# Patient Record
Sex: Male | Born: 1935 | Race: White | Hispanic: No | Smoking: Former smoker
Health system: Southern US, Community
[De-identification: ages and names within clinical notes are randomized; demographics above are authoritative.]

## PROBLEM LIST (undated history)

## (undated) DIAGNOSIS — I10 Essential (primary) hypertension: Secondary | ICD-10-CM

## (undated) DIAGNOSIS — M1990 Unspecified osteoarthritis, unspecified site: Secondary | ICD-10-CM

## (undated) DIAGNOSIS — I639 Cerebral infarction, unspecified: Secondary | ICD-10-CM

## (undated) HISTORY — DX: Cerebral infarction, unspecified: I63.9

## (undated) HISTORY — PX: EYE SURGERY: SHX253

## (undated) HISTORY — PX: SPINE SURGERY: SHX786

## (undated) HISTORY — DX: Unspecified osteoarthritis, unspecified site: M19.90

## (undated) HISTORY — DX: Essential (primary) hypertension: I10

---

## 2015-10-08 DIAGNOSIS — N400 Enlarged prostate without lower urinary tract symptoms: Secondary | ICD-10-CM | POA: Insufficient documentation

## 2015-10-08 DIAGNOSIS — E785 Hyperlipidemia, unspecified: Secondary | ICD-10-CM | POA: Insufficient documentation

## 2015-10-08 DIAGNOSIS — I10 Essential (primary) hypertension: Secondary | ICD-10-CM | POA: Insufficient documentation

## 2015-10-08 DIAGNOSIS — J309 Allergic rhinitis, unspecified: Secondary | ICD-10-CM | POA: Insufficient documentation

## 2016-11-01 DIAGNOSIS — R0609 Other forms of dyspnea: Secondary | ICD-10-CM | POA: Insufficient documentation

## 2017-05-10 DIAGNOSIS — G8929 Other chronic pain: Secondary | ICD-10-CM | POA: Insufficient documentation

## 2018-10-29 DIAGNOSIS — H906 Mixed conductive and sensorineural hearing loss, bilateral: Secondary | ICD-10-CM | POA: Insufficient documentation

## 2019-06-12 DIAGNOSIS — R001 Bradycardia, unspecified: Secondary | ICD-10-CM | POA: Insufficient documentation

## 2020-10-21 ENCOUNTER — Emergency Department (INDEPENDENT_AMBULATORY_CARE_PROVIDER_SITE_OTHER)
Admission: RE | Admit: 2020-10-21 | Discharge: 2020-10-21 | Disposition: A | Discharge status: Home / Self Care | Payer: Medicare Other | Source: Ambulatory Visit

## 2020-10-21 ENCOUNTER — Emergency Department (INDEPENDENT_AMBULATORY_CARE_PROVIDER_SITE_OTHER): Payer: Medicare Other

## 2020-10-21 ENCOUNTER — Other Ambulatory Visit: Payer: Self-pay

## 2020-10-21 VITALS — BP 160/74 | HR 60 | Temp 98.3°F | Ht 71.0 in | Wt 190.0 lb

## 2020-10-21 DIAGNOSIS — M436 Torticollis: Secondary | ICD-10-CM

## 2020-10-21 DIAGNOSIS — M542 Cervicalgia: Secondary | ICD-10-CM

## 2020-10-21 DIAGNOSIS — M4720 Other spondylosis with radiculopathy, site unspecified: Secondary | ICD-10-CM

## 2020-10-21 MED ORDER — DICLOFENAC SODIUM 75 MG PO TBEC
75.0000 mg | DELAYED_RELEASE_TABLET | Freq: Two times a day (BID) | ORAL | 0 refills | Status: DC
Start: 2020-10-21 — End: 2022-08-02

## 2020-10-21 MED ORDER — METHOCARBAMOL 500 MG PO TABS: 500.0000 mg | ORAL_TABLET | Freq: Four times a day (QID) | ORAL | 0 refills | Status: DC

## 2020-10-21 NOTE — Discharge Instructions (Signed)
Return if any problems.

## 2020-10-21 NOTE — ED Triage Notes (Signed)
Neck pain x 3 days, denies injury Vaccinated

## 2020-10-22 ENCOUNTER — Telehealth: Payer: Self-pay

## 2020-10-22 NOTE — ED Provider Notes (Signed)
[facility]    CSN: [id number] Arrival date & time: 10/21/20  1739      History   Chief Complaint Chief Complaint  Patient presents with   Neck Pain    HPI Eric Villa is a 84 y.o. male.   The history is provided by the patient.  Neck Pain Pain location:  Occipital region Quality:  Aching Pain radiates to:  Does not radiate Pain severity:  Moderate Pain is:  Same all the time Onset quality:  Gradual Duration:  4 days Progression:  Worsening Chronicity:  New Relieved by:  Nothing Worsened by:  Nothing Ineffective treatments:  None tried Associated symptoms: no numbness   Risk factors: hx of spinal trauma   Pt noticed pain after waking up  History reviewed. No pertinent past medical history.  There are no problems to display for this patient.   History reviewed. No pertinent surgical history.     Home Medications    Prior to Admission medications   Medication Sig Start Date End Date Taking? Authorizing Provider  quinapril (ACCUPRIL) 10 MG tablet Take 10 mg by mouth daily.   Yes [provider]  simvastatin (ZOCOR) 10 MG tablet Take 10 mg by mouth daily.   Yes [provider]  diclofenac (VOLTAREN) 75 MG EC tablet Take 1 tablet (75 mg total) by mouth 2 (two) times daily. 10/21/20   Elson Areas, PA-C  methocarbamol (ROBAXIN) 500 MG tablet Take 1 tablet (500 mg total) by mouth 4 (four) times daily. 10/21/20   Elson Areas, PA-C    Family History Family History  Problem Relation Age of Onset   Tuberculosis Mother     Social History Social History   Tobacco Use   Smoking status: Former Smoker   Smokeless tobacco: Never Used  Building services engineer Use: Never used  Substance Use Topics   Alcohol use: Yes   Drug use: Not Currently     Allergies   Patient has no known allergies.   Review of Systems Review of Systems  Musculoskeletal: Positive for neck pain.  Neurological: Negative for numbness.  All  other systems reviewed and are negative.    Physical Exam Triage Vital Signs ED Triage Vitals  Enc Vitals Group     BP 10/21/20 1757 (!) 160/74     Pulse Rate 10/21/20 1757 60     Resp --      Temp 10/21/20 1757 98.3 F (36.8 C)     Temp Source 10/21/20 1757 Oral     SpO2 10/21/20 1757 97 %     Weight 10/21/20 1759 190 lb (86.2 kg)     Height 10/21/20 1759 5\' 11"  (1.803 m)     Head Circumference --      Peak Flow --      Pain Score 10/21/20 1757 8     Pain Loc --      Pain Edu? --      Excl. in GC? --    No data found.  Updated Vital Signs BP (!) 160/74 (BP Location: Right Arm)    Pulse 60    Temp 98.3 F (36.8 C) (Oral)    Ht 5\' 11"  (1.803 m)    Wt 86.2 kg    SpO2 97%    BMI 26.50 kg/m   Visual Acuity Right Eye Distance:   Left Eye Distance:   Bilateral Distance:    Right Eye Near:   Left Eye Near:    Bilateral  Near:     Physical Exam Vitals and nursing note reviewed.  Constitutional:      Appearance: He is well-developed.  HENT:     Head: Normocephalic and atraumatic.  Eyes:     Conjunctiva/sclera: Conjunctivae normal.  Cardiovascular:     Rate and Rhythm: Normal rate and regular rhythm.     Heart sounds: No murmur heard.   Pulmonary:     Effort: Pulmonary effort is normal. No respiratory distress.     Breath sounds: Normal breath sounds.  Abdominal:     Palpations: Abdomen is soft.     Tenderness: There is no abdominal tenderness.  Musculoskeletal:        General: Tenderness present. No swelling.     Cervical back: Neck supple.     Comments: Tender lower neck muscular area , pain with movement,  nv and ns intact  Skin:    General: Skin is warm and dry.  Neurological:     General: No focal deficit present.     Mental Status: He is alert.  Psychiatric:        Mood and Affect: Mood normal.      UC Treatments / Results  Labs (all labs ordered are listed, but only abnormal results are displayed) Labs Reviewed - No data to  display  EKG   Radiology DG Cervical Spine Complete  Result Date: 10/21/2020 CLINICAL DATA:  84 year old male with neck pain. EXAM: CERVICAL SPINE - COMPLETE 4+ VIEW COMPARISON:  None. FINDINGS: There is no acute fracture or subluxation of the cervical spine. There is straightening of normal cervical lordosis which may be positional or due to muscle spasm. Multilevel degenerative changes and spurring. The visualized posterior elements and odontoid appear intact. There is anatomic alignment of the lateral masses of C1 and C2. The soft tissues are unremarkable. IMPRESSION: 1. No acute/traumatic cervical spine pathology. 2. Multilevel degenerative changes. Electronically Signed   By: Elgie Collard M.D.   On: 10/21/2020 18:42    Procedures Procedures (including critical care time)  Medications Ordered in UC Medications - No data to display  Initial Impression / Assessment and Plan / UC Course  I have reviewed the triage vital signs and the nursing notes.  Pertinent labs & imaging results that were available during my care of the patient were reviewed by me and considered in my medical decision making (see chart for details).     MDM:  Xray  No fracture  Pt counseled on torticollis.  Pt advised to follow up with his MD or return for recheck Final Clinical Impressions(s) / UC Diagnoses   Final diagnoses:  Torticollis  Osteoarthritis of spine with radiculopathy, unspecified spinal region     Discharge Instructions     Return if any problems.    ED Prescriptions    Medication Sig Dispense Auth. Provider   methocarbamol (ROBAXIN) 500 MG tablet Take 1 tablet (500 mg total) by mouth 4 (four) times daily. 20 tablet Quanesha Klimaszewski K, [place]   diclofenac (VOLTAREN) 75 MG EC tablet Take 1 tablet (75 mg total) by mouth 2 (two) times daily. 10 tablet Elson Areas, [place]    An After Visit Summary was printed and given to the patient. I have reviewed the PDMP during this encounter.    Elson Areas, [place] 10/22/20 [phone]

## 2020-10-22 NOTE — Telephone Encounter (Signed)
Pt came in to office stating med was not working. Advised to continue to take both rx'd meds and supplment with aleve every 6-8 hrs if needed in between if needed. Alternate heat and ice/ massage. Pt has f/u with PCP 12/8

## 2021-11-04 IMAGING — DX DG CERVICAL SPINE COMPLETE 4+V
6 series · 6 of 6 positions shown · non-contrast
Comparison: None.

CLINICAL DATA: 84-year-old male with neck pain.

EXAM:
CERVICAL SPINE - COMPLETE 4+ VIEW

[c-spine lat]
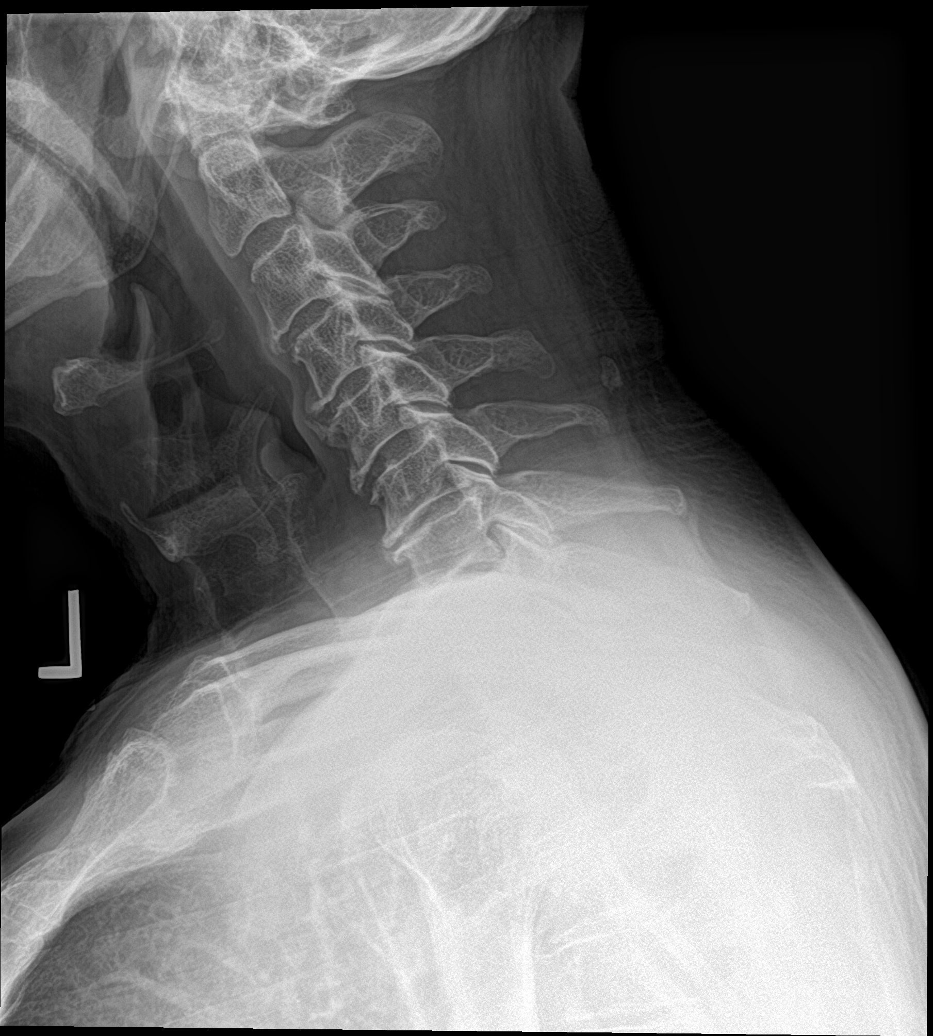

[c-spine obl (1 of 2)]
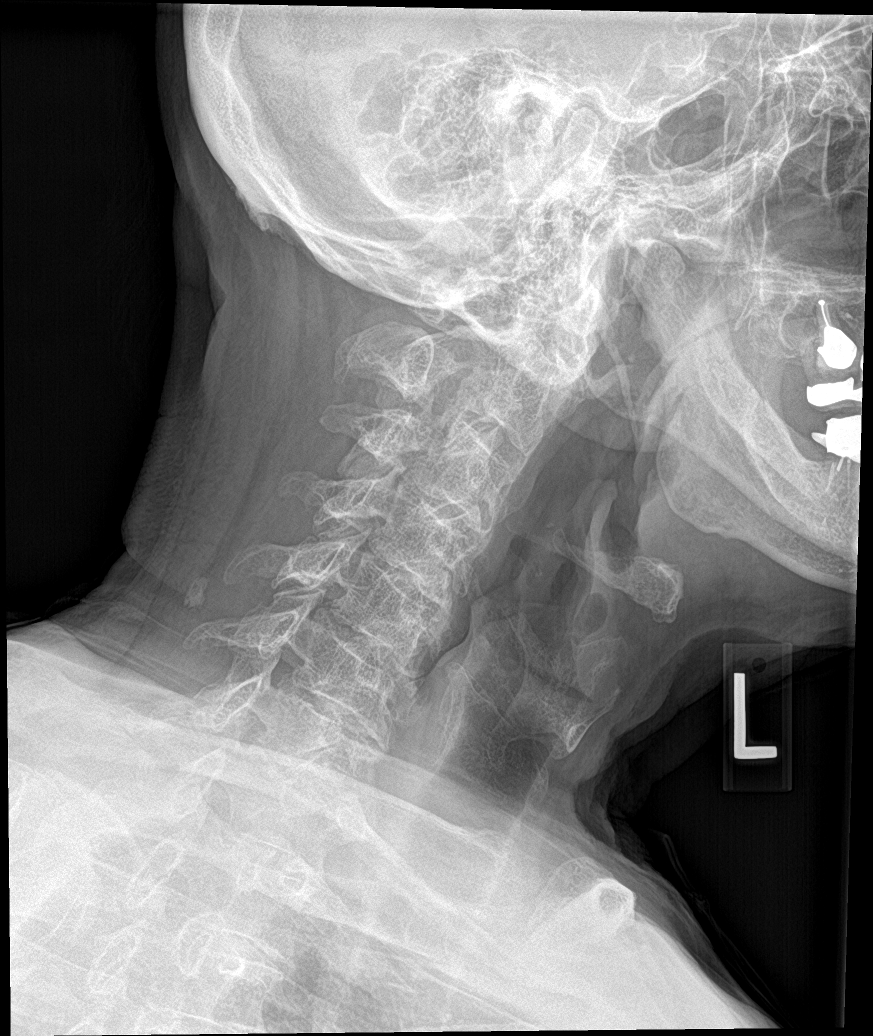

[c-spine obl (2 of 2)]
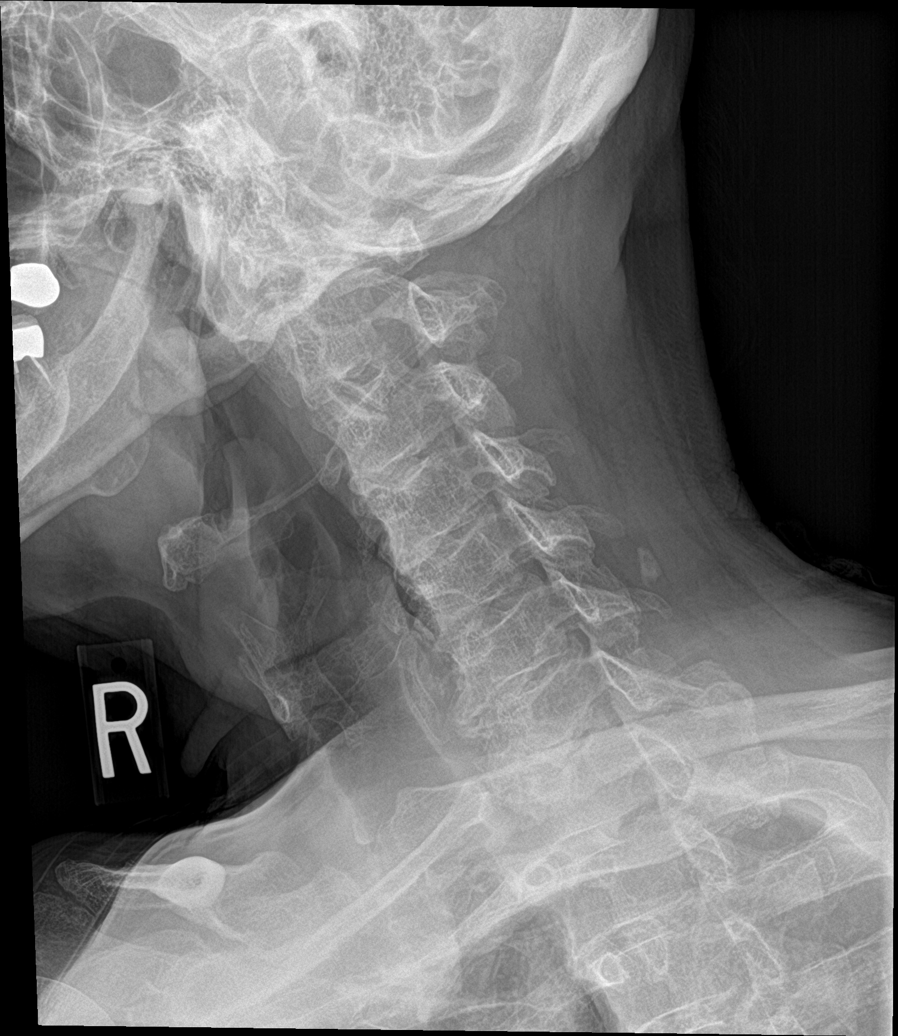

[c-spine ap]
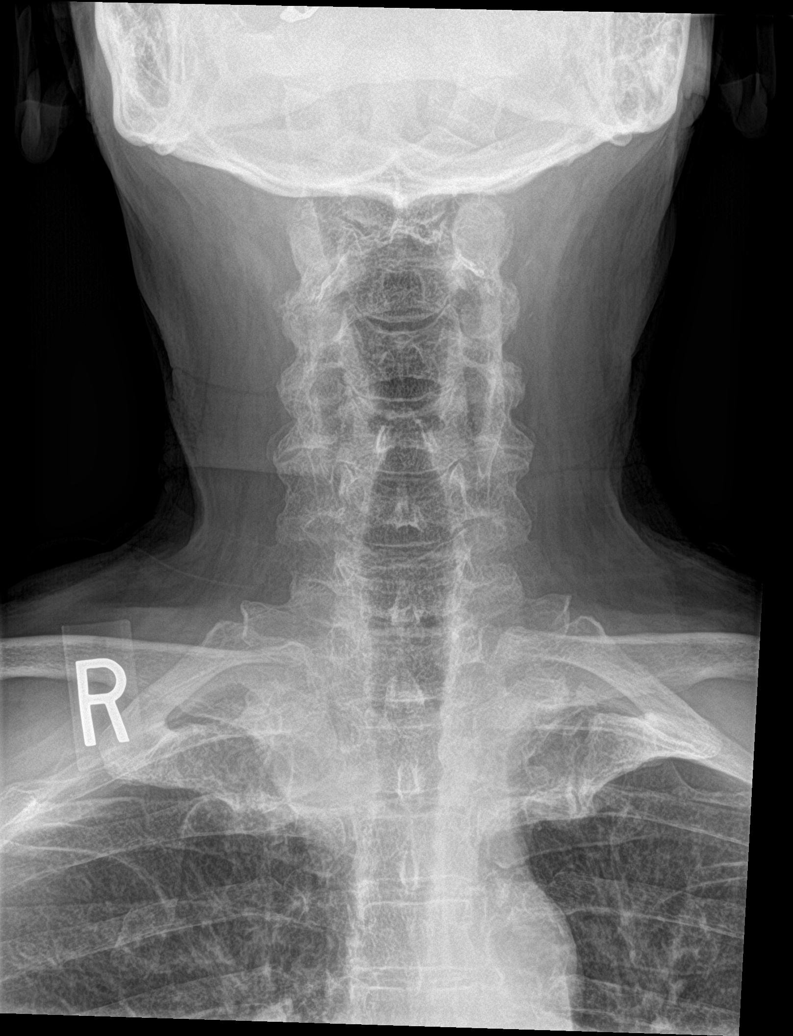

[c-spine open mouth]
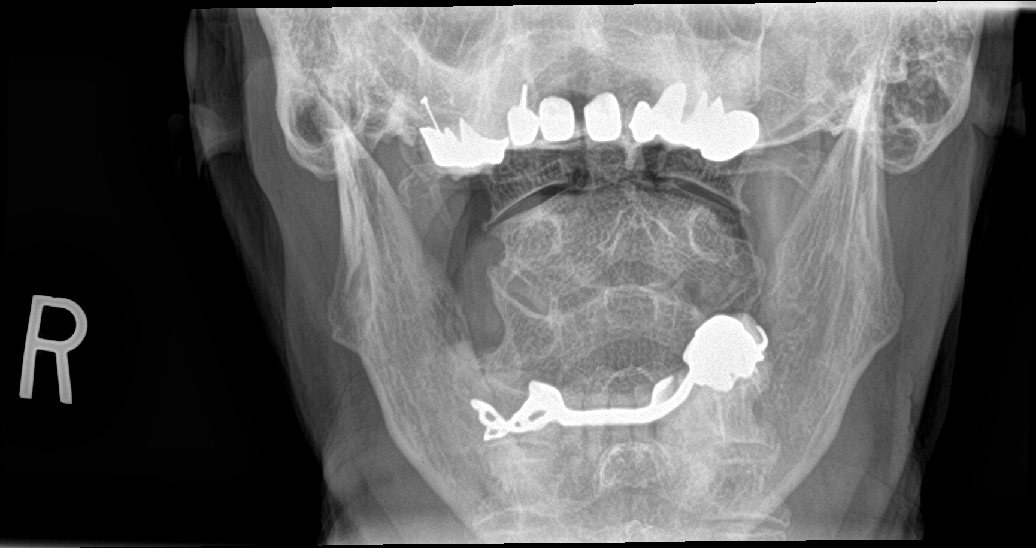

[c-spine swimmers]
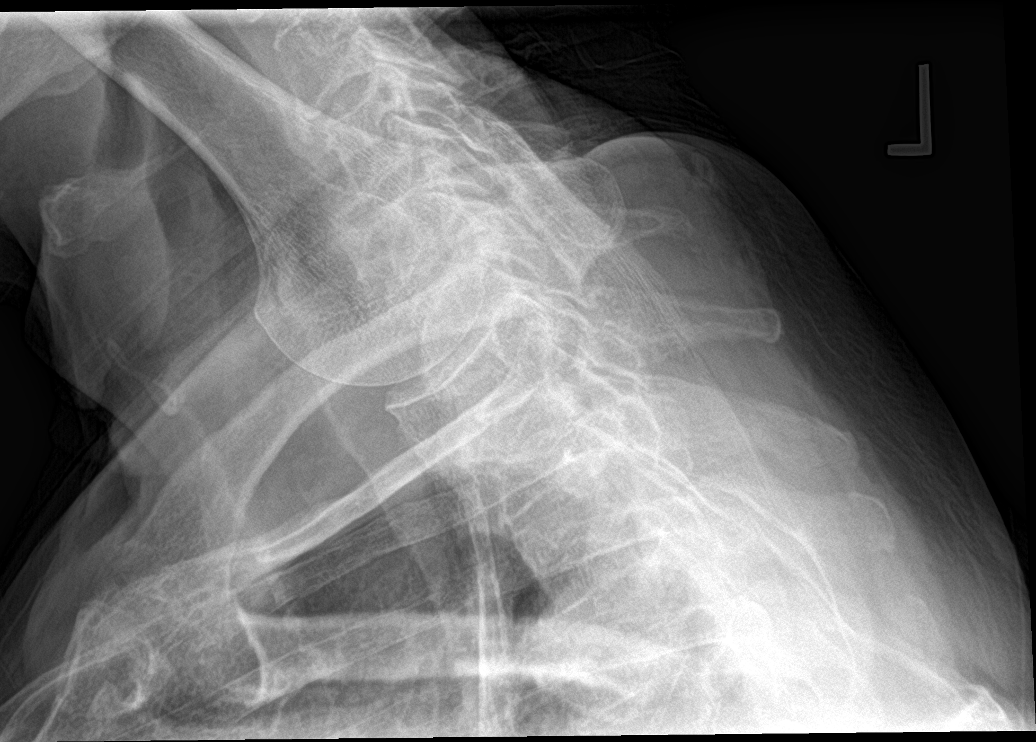

[6 of 6 positions shown; findings below may reference images not displayed]

FINDINGS: There is no acute fracture or subluxation of the cervical spine.
There is straightening of normal cervical lordosis which may be
positional or due to muscle spasm. Multilevel degenerative changes
and spurring. The visualized posterior elements and odontoid appear
intact. There is anatomic alignment of the lateral masses of C1 and
C2. The soft tissues are unremarkable.
IMPRESSION: 1. No acute/traumatic cervical spine pathology.
2. Multilevel degenerative changes.

## 2022-04-27 DIAGNOSIS — I10 Essential (primary) hypertension: Secondary | ICD-10-CM | POA: Diagnosis not present

## 2022-04-27 DIAGNOSIS — E785 Hyperlipidemia, unspecified: Secondary | ICD-10-CM | POA: Insufficient documentation

## 2022-05-06 ENCOUNTER — Telehealth: Payer: Self-pay | Admitting: Family Medicine

## 2022-05-06 NOTE — Telephone Encounter (Signed)
Question: Very nice 86 year old gentleman came in to make a new patient appt with Dr.Matthews for the end of August. He feels like his Medication is causing him to retain fluid. He states his BP is good but he is retaining fluid in his legs. He wanted me to ask if Dr. Ashley Royalty would consider "working him in sooner" so he doesn't have to wait until the end of August to address this issue? Please advise and I will be happy to call patient and let him know either way.

## 2022-05-09 NOTE — Telephone Encounter (Signed)
Sure, we can try to work him in sometime in the next 3-4 weeks.  Doesn't sound super urgent.

## 2022-05-30 DIAGNOSIS — H9313 Tinnitus, bilateral: Secondary | ICD-10-CM | POA: Diagnosis not present

## 2022-05-30 DIAGNOSIS — H938X3 Other specified disorders of ear, bilateral: Secondary | ICD-10-CM | POA: Diagnosis not present

## 2022-05-30 DIAGNOSIS — H90A32 Mixed conductive and sensorineural hearing loss, unilateral, left ear with restricted hearing on the contralateral side: Secondary | ICD-10-CM | POA: Diagnosis not present

## 2022-05-30 DIAGNOSIS — Z886 Allergy status to analgesic agent status: Secondary | ICD-10-CM | POA: Diagnosis not present

## 2022-05-30 DIAGNOSIS — H90A21 Sensorineural hearing loss, unilateral, right ear, with restricted hearing on the contralateral side: Secondary | ICD-10-CM | POA: Diagnosis not present

## 2022-06-15 ENCOUNTER — Ambulatory Visit (INDEPENDENT_AMBULATORY_CARE_PROVIDER_SITE_OTHER): Payer: Medicare Other | Admitting: Family Medicine

## 2022-06-15 ENCOUNTER — Encounter: Payer: Self-pay | Admitting: Family Medicine

## 2022-06-15 DIAGNOSIS — I10 Essential (primary) hypertension: Secondary | ICD-10-CM | POA: Diagnosis not present

## 2022-06-15 DIAGNOSIS — E785 Hyperlipidemia, unspecified: Secondary | ICD-10-CM

## 2022-06-15 MED ORDER — LISINOPRIL-HYDROCHLOROTHIAZIDE 20-25 MG PO TABS: 1.0000 | ORAL_TABLET | Freq: Every day | ORAL | 3 refills | Status: DC

## 2022-06-15 NOTE — Assessment & Plan Note (Signed)
Tolerating simvastatin well at this time.  We will plan to check his lipid panel at upcoming annual exam.

## 2022-06-15 NOTE — Patient Instructions (Signed)
Change lisinopril/HCTZ to 20/25mg 

## 2022-06-15 NOTE — Assessment & Plan Note (Signed)
Blood pressure is elevated today.  Changing blood pressure medication to lisinopril 20/25 mg.  He will continue to monitor blood pressure at home.  He will schedule coming physical with me.

## 2022-06-15 NOTE — Progress Notes (Signed)
Eric Villa - 86 y.o. male MRN [id number]  Date of birth: 1936-11-15  Subjective Chief Complaint  Patient presents with   Establish Care    HPI Eric Villa is an 86 year old male here today for initial visit to establish care.  He has history of hypertension, hyperlipidemia and BPH.  Blood pressure currently treated with lisinopril hydrochlorothiazide 20/12.5 mg he has noticed some mild edema in his legs.  Edema is not painful.  He denies chest pain, shortness of breath, palpitations.  No prior history of heart failure or kidney problems.  He is taking simvastatin for management of hyperlipidemia.  Tolerating this well.  ROS:  A comprehensive ROS was completed and negative except as noted per HPI   Allergies  Allergen Reactions   Acetaminophen Other (See Comments)    GI Upset    Past Medical History:  Diagnosis Date   Arthritis    Hypertension    Stroke [facility])     Past Surgical History:  Procedure Laterality Date   EYE SURGERY     SPINE SURGERY      Social History   Socioeconomic History   Marital status: Divorced    Spouse name: Not on file   Number of children: Not on file   Years of education: Not on file   Highest education level: Not on file  Occupational History   Not on file  Tobacco Use   Smoking status: Former    Packs/day: 1.00    Years: 25.00    Total pack years: 25.00    Types: Cigarettes    Quit date: 11/21/1982    Years since quitting: 39.5    Passive exposure: Never   Smokeless tobacco: Never  Vaping Use   Vaping Use: Never used  Substance and Sexual Activity   Alcohol use: Yes    Alcohol/week: 7.0 standard drinks of alcohol    Types: 4 Glasses of wine, 1 Cans of beer, 2 Shots of liquor per week   Drug use: Not Currently   Sexual activity: Yes    Partners: Female    Birth control/protection: None  Other Topics Concern   Not on file  Social History Narrative   Not on file   Social Determinants of Health   Financial Resource Strain: Not on  file  Food Insecurity: Not on file  Transportation Needs: Not on file  Physical Activity: Not on file  Stress: Not on file  Social Connections: Not on file    Family History  Problem Relation Age of Onset   Tuberculosis Mother    Stroke Father     Health Maintenance  Topic Date Due   COVID-19 Vaccine (6 - Pfizer series) 08/21/2022 (Originally 03/11/2022)   Zoster Vaccines- Shingrix (1 of 2) 09/15/2022 (Originally 04/17/1988)   TETANUS/TDAP  06/16/2023 (Originally 04/17/1957)   INFLUENZA VACCINE  06/21/2022   Pneumonia Vaccine 67+ Years old  Completed   HPV VACCINES  Aged Out     ----------------------------------------------------------------------------------------------------------------------------------------------------------------------------------------------------------------- Physical Exam BP (!) 178/79 (BP Location: Left Arm, Patient Position: Sitting, Cuff Size: Large)   Pulse 69   Ht 5\' 11"  (1.803 m)   Wt 205 lb (93 kg)   SpO2 96%   BMI 28.59 kg/m   Physical Exam Constitutional:      Appearance: Normal appearance.  Eyes:     General: No scleral icterus. Cardiovascular:     Rate and Rhythm: Normal rate and regular rhythm.  Pulmonary:     Effort: Pulmonary effort is normal.  Breath sounds: Normal breath sounds.  Musculoskeletal:     Cervical back: Neck supple.  Neurological:     Mental Status: He is alert.  Psychiatric:        Mood and Affect: Mood normal.        Behavior: Behavior normal.     ------------------------------------------------------------------------------------------------------------------------------------------------------------------------------------------------------------------- Assessment and Plan  Benign essential hypertension Blood pressure is elevated today.  Changing blood pressure medication to lisinopril 20/25 mg.  He will continue to monitor blood pressure at home.  He will schedule coming physical with  me.  Hyperlipidemia Tolerating simvastatin well at this time.  We will plan to check his lipid panel at upcoming annual exam.  Meds ordered this encounter  Medications   lisinopril-hydrochlorothiazide (ZESTORETIC) 20-25 MG tablet    Sig: Take 1 tablet by mouth daily.    Dispense:  90 tablet    Refill:  3    No follow-ups on file.    This visit occurred during the SARS-CoV-2 public health emergency.  Safety protocols were in place, including screening questions prior to the visit, additional usage of staff PPE, and extensive cleaning of exam room while observing appropriate contact time as indicated for disinfecting solutions.

## 2022-07-18 ENCOUNTER — Ambulatory Visit: Payer: Medicare Other | Admitting: Family Medicine

## 2022-08-02 ENCOUNTER — Encounter: Payer: Self-pay | Admitting: Family Medicine

## 2022-08-02 ENCOUNTER — Ambulatory Visit (INDEPENDENT_AMBULATORY_CARE_PROVIDER_SITE_OTHER): Payer: Medicare Other | Admitting: Family Medicine

## 2022-08-02 VITALS — BP 113/68 | HR 90 | Ht 71.0 in | Wt 200.0 lb

## 2022-08-02 DIAGNOSIS — I10 Essential (primary) hypertension: Secondary | ICD-10-CM | POA: Diagnosis not present

## 2022-08-02 DIAGNOSIS — E785 Hyperlipidemia, unspecified: Secondary | ICD-10-CM | POA: Diagnosis not present

## 2022-08-02 DIAGNOSIS — Z0000 Encounter for general adult medical examination without abnormal findings: Secondary | ICD-10-CM

## 2022-08-02 NOTE — Patient Instructions (Signed)
Preventive Care 65 Years and Older, Male Preventive care refers to lifestyle choices and visits with your health care provider that can promote health and wellness. Preventive care visits are also called wellness exams. What can I expect for my preventive care visit? Counseling During your preventive care visit, your health care provider may ask about your: Medical history, including: Past medical problems. Family medical history. History of falls. Current health, including: Emotional well-being. Home life and relationship well-being. Sexual activity. Memory and ability to understand (cognition). Lifestyle, including: Alcohol, nicotine or tobacco, and drug use. Access to firearms. Diet, exercise, and sleep habits. Work and work environment. Sunscreen use. Safety issues such as seatbelt and bike helmet use. Physical exam Your health care provider will check your: Height and weight. These may be used to calculate your BMI (body mass index). BMI is a measurement that tells if you are at a healthy weight. Waist circumference. This measures the distance around your waistline. This measurement also tells if you are at a healthy weight and may help predict your risk of certain diseases, such as type 2 diabetes and high blood pressure. Heart rate and blood pressure. Body temperature. Skin for abnormal spots. What immunizations do I need?  Vaccines are usually given at various ages, according to a schedule. Your health care provider will recommend vaccines for you based on your age, medical history, and lifestyle or other factors, such as travel or where you work. What tests do I need? Screening Your health care provider may recommend screening tests for certain conditions. This may include: Lipid and cholesterol levels. Diabetes screening. This is done by checking your blood sugar (glucose) after you have not eaten for a while (fasting). Hepatitis C test. Hepatitis B test. HIV (human  immunodeficiency virus) test. STI (sexually transmitted infection) testing, if you are at risk. Lung cancer screening. Colorectal cancer screening. Prostate cancer screening. Abdominal aortic aneurysm (AAA) screening. You may need this if you are a current or former smoker. Talk with your health care provider about your test results, treatment options, and if necessary, the need for more tests. Follow these instructions at home: Eating and drinking  Eat a diet that includes fresh fruits and vegetables, whole grains, lean protein, and low-fat dairy products. Limit your intake of foods with high amounts of sugar, saturated fats, and salt. Take vitamin and mineral supplements as recommended by your health care provider. Do not drink alcohol if your health care provider tells you not to drink. If you drink alcohol: Limit how much you have to 0-2 drinks a day. Know how much alcohol is in your drink. In the [place] one drink equals one 12 oz bottle of beer (355 mL), one 5 oz glass of wine (148 mL), or one 1 oz glass of hard liquor (44 mL). Lifestyle Brush your teeth every morning and night with fluoride toothpaste. Floss one time each day. Exercise for at least 30 minutes 5 or more days each week. Do not use any products that contain nicotine or tobacco. These products include cigarettes, chewing tobacco, and vaping devices, such as e-cigarettes. If you need help quitting, ask your health care provider. Do not use drugs. If you are sexually active, practice safe sex. Use a condom or other form of protection to prevent STIs. Take aspirin only as told by your health care provider. Make sure that you understand how much to take and what form to take. Work with your health care provider to find out whether it is safe   and beneficial for you to take aspirin daily. Ask your health care provider if you need to take a cholesterol-lowering medicine (statin). Find healthy ways to manage stress, such  as: Meditation, yoga, or listening to music. Journaling. Talking to a trusted person. Spending time with friends and family. Safety Always wear your seat belt while driving or riding in a vehicle. Do not drive: If you have been drinking alcohol. Do not ride with someone who has been drinking. When you are tired or distracted. While texting. If you have been using any mind-altering substances or drugs. Wear a helmet and other protective equipment during sports activities. If you have firearms in your house, make sure you follow all gun safety procedures. Minimize exposure to UV radiation to reduce your risk of skin cancer. What's next? Visit your health care provider once a year for an annual wellness visit. Ask your health care provider how often you should have your eyes and teeth checked. Stay up to date on all vaccines. This information is not intended to replace advice given to you by your health care provider. Make sure you discuss any questions you have with your health care provider. Document Revised: 05/05/2021 Document Reviewed: 05/05/2021 Elsevier Patient Education  2023 Elsevier Inc.  

## 2022-08-02 NOTE — Progress Notes (Signed)
Eric Villa - 86 y.o. male MRN [id number]  Date of birth: Nov 14, 1936  Subjective Chief Complaint  Patient presents with   Annual Exam    HPI Eric Villa is a 86 y.o. male here today for annual exam.   Reports that he is doing quite well.  Doing well with chronic medications.  BP remains well controlled.  He has started using Hemp oil/CBD for joint pain.  No longer using voltaren gel.  He is very active.  Feels like his diet is is healthy as well.   He is a non-smoker.  He consume EtOH occasionally.   He is due for flu, Tdap and shingrix vaccines.   Review of Systems  Constitutional:  Negative for chills, fever, malaise/fatigue and weight loss.  HENT:  Negative for congestion, ear pain and sore throat.   Eyes:  Negative for blurred vision, double vision and pain.  Respiratory:  Negative for cough and shortness of breath.   Cardiovascular:  Negative for chest pain and palpitations.  Gastrointestinal:  Negative for abdominal pain, blood in stool, constipation, heartburn and nausea.  Genitourinary:  Negative for dysuria and urgency.  Musculoskeletal:  Negative for joint pain and myalgias.  Neurological:  Negative for dizziness and headaches.  Endo/Heme/Allergies:  Does not bruise/bleed easily.  Psychiatric/Behavioral:  Negative for depression. The patient is not nervous/anxious and does not have insomnia.     Allergies  Allergen Reactions   Acetaminophen Other (See Comments)    GI Upset    Past Medical History:  Diagnosis Date   Arthritis    Hypertension    Stroke [facility])     Past Surgical History:  Procedure Laterality Date   EYE SURGERY     SPINE SURGERY      Social History   Socioeconomic History   Marital status: Divorced    Spouse name: Not on file   Number of children: Not on file   Years of education: Not on file   Highest education level: Not on file  Occupational History   Not on file  Tobacco Use   Smoking status: Former    Packs/day: 1.00     Years: 25.00    Total pack years: 25.00    Types: Cigarettes    Quit date: 11/21/1982    Years since quitting: 39.7    Passive exposure: Never   Smokeless tobacco: Never  Vaping Use   Vaping Use: Never used  Substance and Sexual Activity   Alcohol use: Yes    Alcohol/week: 7.0 standard drinks of alcohol    Types: 4 Glasses of wine, 1 Cans of beer, 2 Shots of liquor per week   Drug use: Not Currently   Sexual activity: Yes    Partners: Female    Birth control/protection: None  Other Topics Concern   Not on file  Social History Narrative   Not on file   Social Determinants of Health   Financial Resource Strain: Not on file  Food Insecurity: Not on file  Transportation Needs: Not on file  Physical Activity: Not on file  Stress: Not on file  Social Connections: Not on file    Family History  Problem Relation Age of Onset   Tuberculosis Mother    Stroke Father     Health Maintenance  Topic Date Due   COVID-19 Vaccine (6 - Pfizer series) 08/21/2022 (Originally 03/11/2022)   Zoster Vaccines- Shingrix (1 of 2) 09/15/2022 (Originally 04/17/1988)   INFLUENZA VACCINE  02/19/2023 (Originally 06/21/2022)   Eric Villa  06/16/2023 (Originally 04/17/1957)   Pneumonia Vaccine 76+ Years old  Completed   HPV VACCINES  Aged Out     ----------------------------------------------------------------------------------------------------------------------------------------------------------------------------------------------------------------- Physical Exam BP 113/68 (BP Location: Left Arm, Patient Position: Sitting, Cuff Size: Normal)   Pulse 90   Ht 5\' 11"  (1.803 m)   Wt 200 lb (90.7 kg)   SpO2 95%   BMI 27.89 kg/m   Physical Exam Constitutional:      General: He is not in acute distress. HENT:     Head: Normocephalic and atraumatic.     Right Ear: Tympanic membrane and external ear normal.     Left Ear: Tympanic membrane and external ear normal.  Eyes:     General: No scleral  icterus. Neck:     Thyroid: No thyromegaly.  Cardiovascular:     Rate and Rhythm: Normal rate and regular rhythm.     Heart sounds: Normal heart sounds.  Pulmonary:     Effort: Pulmonary effort is normal.     Breath sounds: Normal breath sounds.  Abdominal:     General: Bowel sounds are normal. There is no distension.     Palpations: Abdomen is soft.     Tenderness: There is no abdominal tenderness. There is no guarding.  Musculoskeletal:     Cervical back: Normal range of motion.  Lymphadenopathy:     Cervical: No cervical adenopathy.  Skin:    General: Skin is warm and dry.     Findings: No rash.  Neurological:     Mental Status: He is alert and oriented to person, place, and time.     Cranial Nerves: No cranial nerve deficit.     Motor: No abnormal muscle tone.  Psychiatric:        Mood and Affect: Mood normal.        Behavior: Behavior normal.     ------------------------------------------------------------------------------------------------------------------------------------------------------------------------------------------------------------------- Assessment and Plan  Well adult exam Well adult Orders Placed This Encounter  Procedures   COMPLETE METABOLIC PANEL WITH GFR   CBC with Differential   Lipid Panel w/reflex Direct LDL  Screenings: per lab orders Immunizations:  He will get Shingrix at his pharmacy.  Declines flu vaccine. He believes that Tdap is up to date Anticipatory guidance/Risk factor reduction:  Recommendations per AVS.     No orders of the defined types were placed in this encounter.   Return in about 6 months (around 01/31/2023) for HTN.    This visit occurred during the SARS-CoV-2 public health emergency.  Safety protocols were in place, including screening questions prior to the visit, additional usage of staff PPE, and extensive cleaning of exam room while observing appropriate contact time as indicated for disinfecting solutions.

## 2022-08-02 NOTE — Assessment & Plan Note (Signed)
Well adult Orders Placed This Encounter  Procedures  . COMPLETE METABOLIC PANEL WITH GFR  . CBC with Differential  . Lipid Panel w/reflex Direct LDL  Screenings: per lab orders Immunizations:  He will get Shingrix at his pharmacy.  Declines flu vaccine. He believes that Tdap is up to date Anticipatory guidance/Risk factor reduction:  Recommendations per AVS.

## 2022-08-04 DIAGNOSIS — E785 Hyperlipidemia, unspecified: Secondary | ICD-10-CM | POA: Diagnosis not present

## 2022-08-04 DIAGNOSIS — Z0000 Encounter for general adult medical examination without abnormal findings: Secondary | ICD-10-CM | POA: Diagnosis not present

## 2022-08-04 DIAGNOSIS — I10 Essential (primary) hypertension: Secondary | ICD-10-CM | POA: Diagnosis not present

## 2022-08-05 LAB — COMPLETE METABOLIC PANEL WITH GFR
AG Ratio: 2 (calc) (ref 1.0–2.5)
ALT: 18 U/L (ref 9–46)
AST: 16 U/L (ref 10–35)
Albumin: 4.1 g/dL (ref 3.6–5.1)
Alkaline phosphatase (APISO): 54 U/L (ref 35–144)
BUN: 19 mg/dL (ref 7–25)
CO2: 29 mmol/L (ref 20–32)
Calcium: 10.3 mg/dL (ref 8.6–10.3)
Chloride: 102 mmol/L (ref 98–110)
Creat: 1.04 mg/dL (ref 0.70–1.22)
Globulin: 2.1 g/dL (calc) (ref 1.9–3.7)
Glucose, Bld: 103 mg/dL — ABNORMAL HIGH (ref 65–99)
Potassium: 4.3 mmol/L (ref 3.5–5.3)
Sodium: 138 mmol/L (ref 135–146)
Total Bilirubin: 1 mg/dL (ref 0.2–1.2)
Total Protein: 6.2 g/dL (ref 6.1–8.1)
eGFR: 71 mL/min/{1.73_m2} (ref >=60)

## 2022-08-05 LAB — CBC WITH DIFFERENTIAL/PLATELET
Absolute Monocytes: 495 cells/uL (ref 200–950)
Basophils Absolute: 18 cells/uL (ref 0–200)
Basophils Relative: 0.4 %
Eosinophils Absolute: 90 cells/uL (ref 15–500)
Eosinophils Relative: 2 %
HCT: 48.1 % (ref 38.5–50.0)
Hemoglobin: 16.1 g/dL (ref 13.2–17.1)
Lymphs Abs: 963 cells/uL (ref 850–3900)
MCH: 31 pg (ref 27.0–33.0)
MCHC: 33.5 g/dL (ref 32.0–36.0)
MCV: 92.5 fL (ref 80.0–100.0)
MPV: 10.4 fL (ref 7.5–12.5)
Monocytes Relative: 11 %
Neutro Abs: 2934 cells/uL (ref 1500–7800)
Neutrophils Relative %: 65.2 %
Platelets: 198 10*3/uL (ref 140–400)
RBC: 5.2 10*6/uL (ref 4.20–5.80)
RDW: 13.3 % (ref 11.0–15.0)
Total Lymphocyte: 21.4 %
WBC: 4.5 10*3/uL (ref 3.8–10.8)

## 2022-08-05 LAB — LIPID PANEL W/REFLEX DIRECT LDL
Cholesterol: 118 mg/dL (ref <200)
HDL: 43 mg/dL (ref >=40)
LDL Cholesterol (Calc): 56 mg/dL (calc)
Non-HDL Cholesterol (Calc): 75 mg/dL (calc) (ref <130)
Total CHOL/HDL Ratio: 2.7 (calc) (ref <5.0)
Triglycerides: 108 mg/dL (ref <150)

## 2022-08-15 ENCOUNTER — Ambulatory Visit (INDEPENDENT_AMBULATORY_CARE_PROVIDER_SITE_OTHER): Payer: Medicare Other | Admitting: Family Medicine

## 2022-08-15 ENCOUNTER — Ambulatory Visit (INDEPENDENT_AMBULATORY_CARE_PROVIDER_SITE_OTHER): Payer: Medicare Other

## 2022-08-15 ENCOUNTER — Telehealth: Payer: Self-pay

## 2022-08-15 ENCOUNTER — Encounter: Payer: Self-pay | Admitting: Family Medicine

## 2022-08-15 VITALS — BP 115/75 | HR 63 | Ht 71.0 in | Wt 197.0 lb

## 2022-08-15 DIAGNOSIS — J019 Acute sinusitis, unspecified: Secondary | ICD-10-CM

## 2022-08-15 DIAGNOSIS — B9689 Other specified bacterial agents as the cause of diseases classified elsewhere: Secondary | ICD-10-CM | POA: Diagnosis not present

## 2022-08-15 DIAGNOSIS — R0981 Nasal congestion: Secondary | ICD-10-CM | POA: Diagnosis not present

## 2022-08-15 DIAGNOSIS — R051 Acute cough: Secondary | ICD-10-CM

## 2022-08-15 DIAGNOSIS — R059 Cough, unspecified: Secondary | ICD-10-CM | POA: Diagnosis not present

## 2022-08-15 MED ORDER — AMOXICILLIN-POT CLAVULANATE 875-125 MG PO TABS: 1.0000 | ORAL_TABLET | Freq: Two times a day (BID) | ORAL | 0 refills | Status: DC

## 2022-08-15 NOTE — Telephone Encounter (Signed)
Called pt and left voicemail to schedule.

## 2022-08-15 NOTE — Addendum Note (Signed)
Addended by: Owens Loffler on: 08/15/2022 02:31 PM   Modules accepted: Orders

## 2022-08-15 NOTE — Assessment & Plan Note (Signed)
-   tight cough on exam. Will order cxr to rule out pna

## 2022-08-15 NOTE — Telephone Encounter (Signed)
Pt lvm stating he had a very bad cold. Please contact the patient to schedule an appt. Can be virtual.   Thanks

## 2022-08-15 NOTE — Progress Notes (Signed)
Acute Office Visit  Subjective:     Patient ID: Eric Villa, male    DOB: 10-04-1936, 86 y.o.   MRN: [id number]  Chief Complaint  Patient presents with   Cough   Nasal Congestion    Cough Associated symptoms include headaches. Pertinent negatives include no chest pain, chills, fever or shortness of breath.   Patient is in today for congestion for about two weeks. He also notes sinus pressure, headaches, and mucus production. Denies wheezing. Does say he had chills.   Review of Systems  Constitutional:  Negative for chills and fever.  HENT:  Positive for sinus pain.   Respiratory:  Positive for cough. Negative for shortness of breath.   Cardiovascular:  Negative for chest pain.  Neurological:  Positive for headaches.        Objective:    BP 115/75   Pulse 63   Ht 5\' 11"  (1.803 m)   Wt 197 lb (89.4 kg)   SpO2 96%   BMI 27.48 kg/m    Physical Exam Vitals and nursing note reviewed.  Constitutional:      General: He is not in acute distress.    Appearance: Normal appearance.  HENT:     Head: Normocephalic and atraumatic.     Comments: Sinus tenderness to palpation of frontal sinuses    Right Ear: External ear normal.     Left Ear: External ear normal.     Nose: Nose normal.     Mouth/Throat:     Pharynx: Posterior oropharyngeal erythema present. No oropharyngeal exudate.  Eyes:     Conjunctiva/sclera: Conjunctivae normal.  Cardiovascular:     Rate and Rhythm: Normal rate and regular rhythm.  Pulmonary:     Effort: Pulmonary effort is normal.     Breath sounds: Normal breath sounds. No wheezing.  Musculoskeletal:     Cervical back: No tenderness.  Neurological:     General: No focal deficit present.     Mental Status: He is alert and oriented to person, place, and time.  Psychiatric:        Mood and Affect: Mood normal.        Behavior: Behavior normal.        Thought Content: Thought content normal.        Judgment: Judgment normal.     No  results found for any visits on 08/15/22.      Assessment & Plan:   Problem List Items Addressed This Visit       Respiratory   Acute bacterial rhinosinusitis - Primary    - due to symptom onset of two weeks with sinus pressure and congestion believe this is a bacterial sinus infection - will treat with augmentin       Relevant Medications   amoxicillin-clavulanate (AUGMENTIN) 875-125 MG tablet   Congestion of nasal sinus    - flu test ordered and interpreted by myself as negative.       Relevant Orders   POCT Influenza A/B     Other   Acute cough    - tight cough on exam. Will order cxr to rule out pna      Relevant Orders   DG Chest 2 View    Meds ordered this encounter  Medications   amoxicillin-clavulanate (AUGMENTIN) 875-125 MG tablet    Sig: Take 1 tablet by mouth 2 (two) times daily for 10 days.    Dispense:  20 tablet    Refill:  0    Return if  symptoms worsen or fail to improve.  Owens Loffler, DO

## 2022-08-15 NOTE — Assessment & Plan Note (Addendum)
-   flu test ordered and interpreted by myself as negative.

## 2022-08-15 NOTE — Assessment & Plan Note (Addendum)
-   due to symptom onset of two weeks with sinus pressure and congestion believe this is a bacterial sinus infection - will treat with augmentin

## 2022-11-02 DIAGNOSIS — Z20822 Contact with and (suspected) exposure to covid-19: Secondary | ICD-10-CM | POA: Diagnosis not present

## 2022-11-02 DIAGNOSIS — R051 Acute cough: Secondary | ICD-10-CM | POA: Diagnosis not present

## 2023-02-13 DIAGNOSIS — M79671 Pain in right foot: Secondary | ICD-10-CM | POA: Diagnosis not present

## 2023-02-27 ENCOUNTER — Ambulatory Visit (INDEPENDENT_AMBULATORY_CARE_PROVIDER_SITE_OTHER): Payer: Medicare Other | Admitting: Family Medicine

## 2023-02-27 ENCOUNTER — Encounter: Payer: Self-pay | Admitting: Family Medicine

## 2023-02-27 VITALS — BP 136/84 | HR 75 | Ht 71.0 in | Wt 197.0 lb

## 2023-02-27 DIAGNOSIS — E785 Hyperlipidemia, unspecified: Secondary | ICD-10-CM | POA: Diagnosis not present

## 2023-02-27 DIAGNOSIS — I10 Essential (primary) hypertension: Secondary | ICD-10-CM | POA: Diagnosis not present

## 2023-02-27 DIAGNOSIS — H906 Mixed conductive and sensorineural hearing loss, bilateral: Secondary | ICD-10-CM | POA: Diagnosis not present

## 2023-02-27 NOTE — Patient Instructions (Addendum)
Try astepro nasal spray

## 2023-02-27 NOTE — Assessment & Plan Note (Signed)
BP remains well controlled. Continue lisinopril/hctz at current strength.   ? ?

## 2023-02-27 NOTE — Progress Notes (Signed)
Eric Villa - 87 y.o. male MRN [id number]  Date of birth: 22-Aug-1936  Subjective Chief Complaint  Patient presents with   Follow-up   Hearing Problem    HPI Eric Villa is a 87 y.o. male here today for follow up.  He reports that he is doing well.   Continues on lisinopril/hctz for management of HTN.  BP is well controlled with this.  No side effects at thist time.  He has not had new chest pain, shortness of breath, palpitations, headache or vision changes.  He does have history of CVA and remains on simvastatin.  Last LDL was 56.  He is tolerating this well at current strength.    He does have some hearing loss.  Had hearing test at [facility] for hearing aids.  Has had eustachian tube dysfunction that is chronic.  He has seen ENT previously without much help.  He reports that flonase made him feel sick.     ROS:  A comprehensive ROS was completed and negative except as noted per HPI  No Known Allergies  Past Medical History:  Diagnosis Date   Arthritis    Hypertension    Stroke     Past Surgical History:  Procedure Laterality Date   EYE SURGERY     SPINE SURGERY      Social History   Socioeconomic History   Marital status: Divorced    Spouse name: Not on file   Number of children: Not on file   Years of education: Not on file   Highest education level: Associate degree: occupational, Scientist, product/process development, or vocational program  Occupational History   Not on file  Tobacco Use   Smoking status: Former    Packs/day: 1.00    Years: 25.00    Additional pack years: 0.00    Total pack years: 25.00    Types: Cigarettes    Quit date: 11/21/1982    Years since quitting: 40.2    Passive exposure: Never   Smokeless tobacco: Never  Vaping Use   Vaping Use: Never used  Substance and Sexual Activity   Alcohol use: Yes    Alcohol/week: 7.0 standard drinks of alcohol    Types: 4 Glasses of wine, 1 Cans of beer, 2 Shots of liquor per week   Drug use: Not Currently   Sexual activity:  Yes    Partners: Female    Birth control/protection: None  Other Topics Concern   Not on file  Social History Narrative   Not on file   Social Determinants of Health   Financial Resource Strain: Not on file  Food Insecurity: No Food Insecurity (02/25/2023)   Hunger Vital Sign    Worried About Running Out of Food in the Last Year: Never true    Ran Out of Food in the Last Year: Never true  Transportation Needs: No Transportation Needs (02/25/2023)   PRAPARE - Administrator, Civil Service (Medical): No    Lack of Transportation (Non-Medical): No  Physical Activity: Insufficiently Active (02/25/2023)   Exercise Vital Sign    Days of Exercise per Week: 2 days    Minutes of Exercise per Session: 30 min  Stress: No Stress Concern Present (02/25/2023)   [employer] of Occupational Health - Occupational Stress Questionnaire    Feeling of Stress : Not at all  Social Connections: Unknown (02/25/2023)   Social Connection and Isolation Panel [NHANES]    Frequency of Communication with Friends and Family: Twice a week  Frequency of Social Gatherings with Friends and Family: Twice a week    Attends Religious Services: Never    Diplomatic Services operational officer: Not on file    Attends Banker Meetings: Not on file    Marital Status: Divorced    Family History  Problem Relation Age of Onset   Tuberculosis Mother    Stroke Father     Health Maintenance  Topic Date Due   DTaP/Tdap/Td (1 - Tdap) Never done   [employer] Annual Wellness (AWV)  08/29/2023 (Originally 11/10/2022)   COVID-19 Vaccine (7 - 2023-24 season) 09/14/2023 (Originally 12/08/2022)   INFLUENZA VACCINE  06/22/2023   Pneumonia Vaccine 51+ Years old  Completed   Zoster Vaccines- Shingrix  Completed   HPV VACCINES  Aged Out      ----------------------------------------------------------------------------------------------------------------------------------------------------------------------------------------------------------------- Physical Exam BP 136/84 (BP Location: Left Arm, Patient Position: Sitting, Cuff Size: Normal)   Pulse 75   Ht 5\' 11"  (1.803 m)   Wt 197 lb (89.4 kg)   SpO2 98%   BMI 27.48 kg/m   Physical Exam Constitutional:      Appearance: Normal appearance.  HENT:     Head: Normocephalic and atraumatic.  Eyes:     General: No scleral icterus. Cardiovascular:     Rate and Rhythm: Normal rate and regular rhythm.  Pulmonary:     Effort: Pulmonary effort is normal.     Breath sounds: Normal breath sounds.  Neurological:     Mental Status: He is alert.  Psychiatric:        Mood and Affect: Mood normal.        Behavior: Behavior normal.     ------------------------------------------------------------------------------------------------------------------------------------------------------------------------------------------------------------------- Assessment and Plan  Benign essential hypertension BP remains well controlled.  Continue lisinopril/hctz at current strength.    Hyperlipidemia Continue simvastatin at current strength.   Mixed conductive and sensorineural hearing loss of both ears He has chronic eustachian tube dysfunction.  Declines ENT referral.  Did not tolerate flonase well.  Adding astepro, he will get this OTC.     No orders of the defined types were placed in this encounter.   Return in about 6 months (around 08/29/2023) for annual exam/fasting labs.    This visit occurred during the SARS-CoV-2 public health emergency.  Safety protocols were in place, including screening questions prior to the visit, additional usage of staff PPE, and extensive cleaning of exam room while observing appropriate contact time as indicated for disinfecting solutions.

## 2023-02-27 NOTE — Assessment & Plan Note (Signed)
He has chronic eustachian tube dysfunction.  Declines ENT referral.  Did not tolerate flonase well.  Adding astepro, he will get this OTC.

## 2023-02-27 NOTE — Assessment & Plan Note (Signed)
Continue simvastatin at current strength.   

## 2023-02-28 LAB — CBC WITH DIFFERENTIAL/PLATELET
Absolute Monocytes: 524 cells/uL (ref 200–950)
Basophils Absolute: 17 cells/uL (ref 0–200)
Basophils Relative: 0.3 %
Eosinophils Absolute: 97 cells/uL (ref 15–500)
Eosinophils Relative: 1.7 %
HCT: 48.1 % (ref 38.5–50.0)
Hemoglobin: 16.2 g/dL (ref 13.2–17.1)
Lymphs Abs: 1055 cells/uL (ref 850–3900)
MCH: 30 pg (ref 27.0–33.0)
MCHC: 33.7 g/dL (ref 32.0–36.0)
MCV: 89.1 fL (ref 80.0–100.0)
MPV: 10.1 fL (ref 7.5–12.5)
Monocytes Relative: 9.2 %
Neutro Abs: 4007 cells/uL (ref 1500–7800)
Neutrophils Relative %: 70.3 %
Platelets: 227 10*3/uL (ref 140–400)
RBC: 5.4 10*6/uL (ref 4.20–5.80)
RDW: 13.3 % (ref 11.0–15.0)
Total Lymphocyte: 18.5 %
WBC: 5.7 10*3/uL (ref 3.8–10.8)

## 2023-02-28 LAB — BASIC METABOLIC PANEL
BUN: 17 mg/dL (ref 7–25)
CO2: 29 mmol/L (ref 20–32)
Calcium: 10.5 mg/dL — ABNORMAL HIGH (ref 8.6–10.3)
Chloride: 104 mmol/L (ref 98–110)
Creat: 0.94 mg/dL (ref 0.70–1.22)
Glucose, Bld: 101 mg/dL — ABNORMAL HIGH (ref 65–99)
Potassium: 4.6 mmol/L (ref 3.5–5.3)
Sodium: 142 mmol/L (ref 135–146)

## 2023-03-02 ENCOUNTER — Telehealth: Payer: Self-pay | Admitting: Family Medicine

## 2023-03-02 NOTE — Telephone Encounter (Signed)
I called patient and stated medicare doesn't cover these test to go to his local pharmacy

## 2023-03-02 NOTE — Telephone Encounter (Signed)
Medicare does not cover this in the clinic.  I would recommend that he have this completed at his local pharmacy.    Thanks!  CM

## 2023-03-02 NOTE — Telephone Encounter (Signed)
Patient is requesting Dtap/Tdap/Td vaccines on mychart please advise.

## 2023-03-21 ENCOUNTER — Telehealth: Payer: Self-pay | Admitting: Family Medicine

## 2023-03-21 NOTE — Telephone Encounter (Signed)
Called patient to schedule Medicare Annual Wellness Visit (AWV). Left message for patient to call back and schedule Medicare Annual Wellness Visit (AWV).  Last date of AWV: Never  Please schedule an appointment at any time with [facility].  If any questions, please contact me at [phone].  Thank you ,  Morgan Jessup Patient Access Advocate II Direct Dial: [phone]  

## 2023-03-21 NOTE — Telephone Encounter (Signed)
Patient called back he refused to have a Medicare Wellness Visit he stated he was fine didn't need it .

## 2023-03-26 ENCOUNTER — Other Ambulatory Visit: Payer: Self-pay | Admitting: Family Medicine

## 2023-03-27 ENCOUNTER — Other Ambulatory Visit: Payer: Self-pay

## 2023-03-27 MED ORDER — SIMVASTATIN 40 MG PO TABS
40.0000 mg | ORAL_TABLET | Freq: Every day | ORAL | 2 refills | Status: DC
  Filled 2023-03-27: qty 90, 90d supply, fill #0

## 2023-03-27 NOTE — Telephone Encounter (Signed)
Patient called requesting a refill of ;   Pharmacy ;

## 2023-03-28 ENCOUNTER — Telehealth: Payer: Self-pay | Admitting: Family Medicine

## 2023-03-28 NOTE — Telephone Encounter (Signed)
Contacted [facility] to schedule their annual wellness visit. Patient declined to schedule AWV at this time.  Cira Servant Patient Engineer, production II Direct Dial: [phone]

## 2023-04-10 DIAGNOSIS — Z85828 Personal history of other malignant neoplasm of skin: Secondary | ICD-10-CM | POA: Diagnosis not present

## 2023-04-10 DIAGNOSIS — R9431 Abnormal electrocardiogram [ECG] [EKG]: Secondary | ICD-10-CM | POA: Diagnosis not present

## 2023-04-10 DIAGNOSIS — I10 Essential (primary) hypertension: Secondary | ICD-10-CM | POA: Diagnosis not present

## 2023-04-10 DIAGNOSIS — Z8673 Personal history of transient ischemic attack (TIA), and cerebral infarction without residual deficits: Secondary | ICD-10-CM | POA: Diagnosis not present

## 2023-04-10 DIAGNOSIS — Z886 Allergy status to analgesic agent status: Secondary | ICD-10-CM | POA: Diagnosis not present

## 2023-04-10 DIAGNOSIS — Z79899 Other long term (current) drug therapy: Secondary | ICD-10-CM | POA: Diagnosis not present

## 2023-04-10 DIAGNOSIS — R5383 Other fatigue: Secondary | ICD-10-CM | POA: Diagnosis not present

## 2023-04-10 DIAGNOSIS — Z87891 Personal history of nicotine dependence: Secondary | ICD-10-CM | POA: Diagnosis not present

## 2023-04-10 DIAGNOSIS — I4891 Unspecified atrial fibrillation: Secondary | ICD-10-CM | POA: Diagnosis not present

## 2023-04-10 DIAGNOSIS — R079 Chest pain, unspecified: Secondary | ICD-10-CM | POA: Diagnosis not present

## 2023-04-20 DIAGNOSIS — I10 Essential (primary) hypertension: Secondary | ICD-10-CM | POA: Diagnosis not present

## 2023-04-20 DIAGNOSIS — I4819 Other persistent atrial fibrillation: Secondary | ICD-10-CM | POA: Insufficient documentation

## 2023-05-01 DIAGNOSIS — I083 Combined rheumatic disorders of mitral, aortic and tricuspid valves: Secondary | ICD-10-CM | POA: Diagnosis not present

## 2023-05-01 DIAGNOSIS — I77810 Thoracic aortic ectasia: Secondary | ICD-10-CM | POA: Diagnosis not present

## 2023-05-01 DIAGNOSIS — I700 Atherosclerosis of aorta: Secondary | ICD-10-CM | POA: Diagnosis not present

## 2023-05-01 DIAGNOSIS — I480 Paroxysmal atrial fibrillation: Secondary | ICD-10-CM | POA: Diagnosis not present

## 2023-05-01 DIAGNOSIS — I4819 Other persistent atrial fibrillation: Secondary | ICD-10-CM | POA: Diagnosis not present

## 2023-05-01 DIAGNOSIS — Z79899 Other long term (current) drug therapy: Secondary | ICD-10-CM | POA: Diagnosis not present

## 2023-05-01 DIAGNOSIS — Z8673 Personal history of transient ischemic attack (TIA), and cerebral infarction without residual deficits: Secondary | ICD-10-CM | POA: Diagnosis not present

## 2023-05-01 DIAGNOSIS — I10 Essential (primary) hypertension: Secondary | ICD-10-CM | POA: Diagnosis not present

## 2023-05-01 DIAGNOSIS — Z7901 Long term (current) use of anticoagulants: Secondary | ICD-10-CM | POA: Diagnosis not present

## 2023-05-01 DIAGNOSIS — I491 Atrial premature depolarization: Secondary | ICD-10-CM | POA: Diagnosis not present

## 2023-05-18 DIAGNOSIS — I10 Essential (primary) hypertension: Secondary | ICD-10-CM | POA: Diagnosis not present

## 2023-05-18 DIAGNOSIS — I4819 Other persistent atrial fibrillation: Secondary | ICD-10-CM | POA: Diagnosis not present

## 2023-05-22 DIAGNOSIS — I4819 Other persistent atrial fibrillation: Secondary | ICD-10-CM | POA: Diagnosis not present

## 2023-06-11 ENCOUNTER — Other Ambulatory Visit: Payer: Self-pay | Admitting: Family Medicine

## 2023-07-05 DIAGNOSIS — E785 Hyperlipidemia, unspecified: Secondary | ICD-10-CM | POA: Diagnosis not present

## 2023-07-05 DIAGNOSIS — E279 Disorder of adrenal gland, unspecified: Secondary | ICD-10-CM | POA: Diagnosis not present

## 2023-07-05 DIAGNOSIS — I10 Essential (primary) hypertension: Secondary | ICD-10-CM | POA: Diagnosis not present

## 2023-07-05 DIAGNOSIS — I6930 Unspecified sequelae of cerebral infarction: Secondary | ICD-10-CM | POA: Diagnosis not present

## 2023-07-05 DIAGNOSIS — M1990 Unspecified osteoarthritis, unspecified site: Secondary | ICD-10-CM | POA: Diagnosis not present

## 2023-07-05 DIAGNOSIS — Z79899 Other long term (current) drug therapy: Secondary | ICD-10-CM | POA: Diagnosis not present

## 2023-07-05 DIAGNOSIS — Z01812 Encounter for preprocedural laboratory examination: Secondary | ICD-10-CM | POA: Diagnosis not present

## 2023-07-05 DIAGNOSIS — Z972 Presence of dental prosthetic device (complete) (partial): Secondary | ICD-10-CM | POA: Diagnosis not present

## 2023-07-05 DIAGNOSIS — Z87891 Personal history of nicotine dependence: Secondary | ICD-10-CM | POA: Diagnosis not present

## 2023-07-05 DIAGNOSIS — Z7901 Long term (current) use of anticoagulants: Secondary | ICD-10-CM | POA: Diagnosis not present

## 2023-07-05 DIAGNOSIS — I4819 Other persistent atrial fibrillation: Secondary | ICD-10-CM | POA: Diagnosis not present

## 2023-07-05 DIAGNOSIS — I2510 Atherosclerotic heart disease of native coronary artery without angina pectoris: Secondary | ICD-10-CM | POA: Diagnosis not present

## 2023-07-05 DIAGNOSIS — J439 Emphysema, unspecified: Secondary | ICD-10-CM | POA: Diagnosis not present

## 2023-07-07 DIAGNOSIS — I4819 Other persistent atrial fibrillation: Secondary | ICD-10-CM | POA: Diagnosis not present

## 2023-07-07 DIAGNOSIS — Z87891 Personal history of nicotine dependence: Secondary | ICD-10-CM | POA: Diagnosis not present

## 2023-07-07 DIAGNOSIS — Z972 Presence of dental prosthetic device (complete) (partial): Secondary | ICD-10-CM | POA: Diagnosis not present

## 2023-07-07 DIAGNOSIS — M1990 Unspecified osteoarthritis, unspecified site: Secondary | ICD-10-CM | POA: Diagnosis not present

## 2023-07-07 DIAGNOSIS — Z7901 Long term (current) use of anticoagulants: Secondary | ICD-10-CM | POA: Diagnosis not present

## 2023-07-07 DIAGNOSIS — I6930 Unspecified sequelae of cerebral infarction: Secondary | ICD-10-CM | POA: Diagnosis not present

## 2023-07-07 DIAGNOSIS — Z79899 Other long term (current) drug therapy: Secondary | ICD-10-CM | POA: Diagnosis not present

## 2023-07-07 DIAGNOSIS — E279 Disorder of adrenal gland, unspecified: Secondary | ICD-10-CM | POA: Diagnosis not present

## 2023-07-07 DIAGNOSIS — Z01812 Encounter for preprocedural laboratory examination: Secondary | ICD-10-CM | POA: Diagnosis not present

## 2023-07-07 DIAGNOSIS — E785 Hyperlipidemia, unspecified: Secondary | ICD-10-CM | POA: Diagnosis not present

## 2023-07-07 DIAGNOSIS — I10 Essential (primary) hypertension: Secondary | ICD-10-CM | POA: Diagnosis not present

## 2023-08-30 ENCOUNTER — Telehealth: Payer: Self-pay | Admitting: Family Medicine

## 2023-08-30 DIAGNOSIS — Z0000 Encounter for general adult medical examination without abnormal findings: Secondary | ICD-10-CM

## 2023-08-30 DIAGNOSIS — E785 Hyperlipidemia, unspecified: Secondary | ICD-10-CM

## 2023-08-30 NOTE — Telephone Encounter (Signed)
Patient is scheduled for Monday October 14th for annual Physical is requesting blood work be ordered prior to his appointment

## 2023-09-01 DIAGNOSIS — Z0000 Encounter for general adult medical examination without abnormal findings: Secondary | ICD-10-CM | POA: Diagnosis not present

## 2023-09-01 DIAGNOSIS — E785 Hyperlipidemia, unspecified: Secondary | ICD-10-CM | POA: Diagnosis not present

## 2023-09-02 LAB — CMP14+EGFR
ALT: 27 [IU]/L (ref 0–44)
AST: 26 [IU]/L (ref 0–40)
Albumin: 4.2 g/dL (ref 3.7–4.7)
Alkaline Phosphatase: 90 [IU]/L (ref 44–121)
BUN/Creatinine Ratio: 17 (ref 10–24)
BUN: 16 mg/dL (ref 8–27)
Bilirubin Total: 0.6 mg/dL (ref 0.0–1.2)
CO2: 21 mmol/L (ref 20–29)
Calcium: 10.4 mg/dL — ABNORMAL HIGH (ref 8.6–10.2)
Chloride: 101 mmol/L (ref 96–106)
Creatinine, Ser: 0.93 mg/dL (ref 0.76–1.27)
Globulin, Total: 2.1 g/dL (ref 1.5–4.5)
Glucose: 130 mg/dL — ABNORMAL HIGH (ref 70–99)
Potassium: 4.5 mmol/L (ref 3.5–5.2)
Sodium: 138 mmol/L (ref 134–144)
Total Protein: 6.3 g/dL (ref 6.0–8.5)
eGFR: 80 mL/min/{1.73_m2} (ref >59)

## 2023-09-02 LAB — CBC WITH DIFFERENTIAL/PLATELET
Basophils Absolute: 0 10*3/uL (ref 0.0–0.2)
Basos: 1 %
EOS (ABSOLUTE): 0.1 10*3/uL (ref 0.0–0.4)
Eos: 2 %
Hematocrit: 45.9 % (ref 37.5–51.0)
Hemoglobin: 15.8 g/dL (ref 13.0–17.7)
Immature Grans (Abs): 0 10*3/uL (ref 0.0–0.1)
Immature Granulocytes: 0 %
Lymphocytes Absolute: 1.1 10*3/uL (ref 0.7–3.1)
Lymphs: 18 %
MCH: 31.3 pg (ref 26.6–33.0)
MCHC: 34.4 g/dL (ref 31.5–35.7)
MCV: 91 fL (ref 79–97)
Monocytes Absolute: 0.6 10*3/uL (ref 0.1–0.9)
Monocytes: 10 %
Neutrophils Absolute: 4.2 10*3/uL (ref 1.4–7.0)
Neutrophils: 69 %
Platelets: 205 10*3/uL (ref 150–450)
RBC: 5.04 x10E6/uL (ref 4.14–5.80)
RDW: 13.3 % (ref 11.6–15.4)
WBC: 6.1 10*3/uL (ref 3.4–10.8)

## 2023-09-02 LAB — LIPID PANEL WITH LDL/HDL RATIO
Cholesterol, Total: 135 mg/dL (ref 100–199)
HDL: 42 mg/dL (ref >39)
LDL Chol Calc (NIH): 64 mg/dL (ref 0–99)
LDL/HDL Ratio: 1.5 {ratio} (ref 0.0–3.6)
Triglycerides: 175 mg/dL — ABNORMAL HIGH (ref 0–149)
VLDL Cholesterol Cal: 29 mg/dL (ref 5–40)

## 2023-09-04 ENCOUNTER — Ambulatory Visit (INDEPENDENT_AMBULATORY_CARE_PROVIDER_SITE_OTHER): Payer: Medicare Other | Admitting: Family Medicine

## 2023-09-04 ENCOUNTER — Encounter: Payer: Self-pay | Admitting: Family Medicine

## 2023-09-04 VITALS — BP 132/64 | HR 72 | Ht 71.0 in | Wt 194.2 lb

## 2023-09-04 DIAGNOSIS — R339 Retention of urine, unspecified: Secondary | ICD-10-CM | POA: Diagnosis not present

## 2023-09-04 DIAGNOSIS — Z0000 Encounter for general adult medical examination without abnormal findings: Secondary | ICD-10-CM | POA: Diagnosis not present

## 2023-09-04 MED ORDER — TAMSULOSIN HCL 0.4 MG PO CAPS: 0.4000 mg | ORAL_CAPSULE | Freq: Every day | ORAL | 1 refills | Status: DC

## 2023-09-04 NOTE — Progress Notes (Signed)
Eulalio Reamy - 87 y.o. male MRN [id number]  Date of birth: 1936/06/24  Subjective Chief Complaint  Patient presents with   Annual Exam    HPI Ketrick Matney is a 87 y.o. male here today for annual exam.   Reports that he is overall doing well.  Has some urinary frequency at night and first thing in the morning.  Dx with A. Fib since last visit with me. Now s/p ablation and feels great.  He is on Eliquis for anticoagulation.  He had labs completed prior to visit. Mild elevation in triglycerides and glucose elevated.   Doing well with current medications.  BP is well controlled.   He remains moderately active.  Feels that diet is pretty good.   Non-smoker.  1 drink per day.   Review of Systems  Constitutional:  Negative for chills, fever, malaise/fatigue and weight loss.  HENT:  Negative for congestion, ear pain and sore throat.   Eyes:  Negative for blurred vision, double vision and pain.  Respiratory:  Negative for cough and shortness of breath.   Cardiovascular:  Negative for chest pain and palpitations.  Gastrointestinal:  Negative for abdominal pain, blood in stool, constipation, heartburn and nausea.  Genitourinary:  Negative for dysuria and urgency.  Musculoskeletal:  Negative for joint pain and myalgias.  Neurological:  Negative for dizziness and headaches.  Endo/Heme/Allergies:  Does not bruise/bleed easily.  Psychiatric/Behavioral:  Negative for depression. The patient is not nervous/anxious and does not have insomnia.     No Known Allergies  Past Medical History:  Diagnosis Date   Arthritis    Hypertension    Stroke [facility])     Past Surgical History:  Procedure Laterality Date   EYE SURGERY     SPINE SURGERY      Social History   Socioeconomic History   Marital status: Divorced    Spouse name: Not on file   Number of children: Not on file   Years of education: Not on file   Highest education level: Associate degree: occupational, Scientist, product/process development, or  vocational program  Occupational History   Not on file  Tobacco Use   Smoking status: Former    Current packs/day: 0.00    Average packs/day: 1 pack/day for 25.0 years (25.0 ttl pk-yrs)    Types: Cigarettes    Start date: 11/21/1957    Quit date: 11/21/1982    Years since quitting: 40.8    Passive exposure: Never   Smokeless tobacco: Never  Vaping Use   Vaping status: Never Used  Substance and Sexual Activity   Alcohol use: Yes    Alcohol/week: 7.0 standard drinks of alcohol    Types: 4 Glasses of wine, 1 Cans of beer, 2 Shots of liquor per week   Drug use: Never   Sexual activity: Yes    Partners: Female    Birth control/protection: None  Other Topics Concern   Not on file  Social History Narrative   Not on file   Social Determinants of Health   Financial Resource Strain: Low Risk  (04/17/2023)   Received from [facility]   Overall Financial Resource Strain (CARDIA)    Difficulty of Paying Living Expenses: Not hard at all  Food Insecurity: No Food Insecurity (04/17/2023)   [facility] from [facility]   Hunger Vital Sign    Worried About Running Out of Food in the Last Year: Never true    Ran Out of Food in the Last Year: Never true  Transportation Needs: No  Transportation Needs (04/17/2023)   Received from [facility] - Transportation    Lack of Transportation (Medical): No    Lack of Transportation (Non-Medical): No  Physical Activity: Insufficiently Active (04/17/2023)   Received from [facility]   Exercise Vital Sign    Days of Exercise per Week: 2 days    Minutes of Exercise per Session: 30 min  Stress: No Stress Concern Present (04/17/2023)   Received from [facility] of Occupational Health - Occupational Stress Questionnaire    Feeling of Stress : Not at all  Social Connections: Socially Integrated (04/17/2023)   Received from [facility]   Social Network    How would you rate your social network (family, work, friends)?:  Good participation with social networks    Family History  Problem Relation Age of Onset   Tuberculosis Mother    Stroke Father     Health Maintenance  Topic Date Due   DTaP/Tdap/Td (1 - Tdap) Never done   [employer] Annual Wellness (AWV)  11/10/2022   INFLUENZA VACCINE  Never done   COVID-19 Vaccine (7 - 2023-24 season) 07/23/2023   Pneumonia Vaccine 30+ Years old  Completed   Zoster Vaccines- Shingrix  Completed   HPV VACCINES  Aged Out     ----------------------------------------------------------------------------------------------------------------------------------------------------------------------------------------------------------------- Physical Exam BP 132/64 (BP Location: Left Arm, Patient Position: Sitting, Cuff Size: Normal)   Pulse 72   Ht 5\' 11"  (1.803 m)   Wt 194 lb 4 oz (88.1 kg)   SpO2 98%   BMI 27.09 kg/m   Physical Exam Constitutional:      General: He is not in acute distress. HENT:     Head: Normocephalic and atraumatic.     Right Ear: Tympanic membrane and external ear normal.     Left Ear: Tympanic membrane and external ear normal.  Eyes:     General: No scleral icterus. Neck:     Thyroid: No thyromegaly.  Cardiovascular:     Rate and Rhythm: Normal rate and regular rhythm.     Heart sounds: Normal heart sounds.  Pulmonary:     Effort: Pulmonary effort is normal.     Breath sounds: Normal breath sounds.  Abdominal:     General: Bowel sounds are normal. There is no distension.     Palpations: Abdomen is soft.     Tenderness: There is no abdominal tenderness. There is no guarding.  Musculoskeletal:     Cervical back: Normal range of motion.  Lymphadenopathy:     Cervical: No cervical adenopathy.  Skin:    General: Skin is warm and dry.     Findings: No rash.  Neurological:     Mental Status: He is alert and oriented to person, place, and time.     Cranial Nerves: No cranial nerve deficit.     Motor: No abnormal muscle tone.   Psychiatric:        Mood and Affect: Mood normal.        Behavior: Behavior normal.     ------------------------------------------------------------------------------------------------------------------------------------------------------------------------------------------------------------------- Assessment and Plan  Well adult exam Well adult Lab results reviewed with him.   Screenings: per lab orders Immunizations:  He will get COVID at pharmacy.  Declines flu vaccine. He believes that Tdap is up to date Anticipatory guidance/Risk factor reduction:  Recommendations per AVS.    Incomplete bladder emptying Adding trial of flomax.     Meds ordered this encounter  Medications   tamsulosin (FLOMAX) 0.4 MG CAPS capsule  Sig: Take 1 capsule (0.4 mg total) by mouth daily.    Dispense:  90 capsule    Refill:  1    Return in about 6 months (around 03/04/2024) for Hypertension.    This visit occurred during the SARS-CoV-2 public health emergency.  Safety protocols were in place, including screening questions prior to the visit, additional usage of staff PPE, and extensive cleaning of exam room while observing appropriate contact time as indicated for disinfecting solutions.

## 2023-09-04 NOTE — Assessment & Plan Note (Signed)
Well adult Lab results reviewed with him.   Screenings: per lab orders Immunizations:  He will get COVID at pharmacy.  Declines flu vaccine. He believes that Tdap is up to date Anticipatory guidance/Risk factor reduction:  Recommendations per AVS.

## 2023-09-04 NOTE — Assessment & Plan Note (Signed)
Adding trial of flomax.

## 2023-09-04 NOTE — Patient Instructions (Signed)
Preventive Care 22 Years and Older, Male Preventive care refers to lifestyle choices and visits with your health care provider that can promote health and wellness. Preventive care visits are also called wellness exams. What can I expect for my preventive care visit? Counseling During your preventive care visit, your health care provider may ask about your: Medical history, including: Past medical problems. Family medical history. History of falls. Current health, including: Emotional well-being. Home life and relationship well-being. Sexual activity. Memory and ability to understand (cognition). Lifestyle, including: Alcohol, nicotine or tobacco, and drug use. Access to firearms. Diet, exercise, and sleep habits. Work and work Astronomer. Sunscreen use. Safety issues such as seatbelt and bike helmet use. Physical exam Your health care provider will check your: Height and weight. These may be used to calculate your BMI (body mass index). BMI is a measurement that tells if you are at a healthy weight. Waist circumference. This measures the distance around your waistline. This measurement also tells if you are at a healthy weight and may help predict your risk of certain diseases, such as type 2 diabetes and high blood pressure. Heart rate and blood pressure. Body temperature. Skin for abnormal spots. What immunizations do I need?  Vaccines are usually given at various ages, according to a schedule. Your health care provider will recommend vaccines for you based on your age, medical history, and lifestyle or other factors, such as travel or where you work. What tests do I need? Screening Your health care provider may recommend screening tests for certain conditions. This may include: Lipid and cholesterol levels. Diabetes screening. This is done by checking your blood sugar (glucose) after you have not eaten for a while (fasting). Hepatitis C test. Hepatitis B test. HIV (human  immunodeficiency virus) test. STI (sexually transmitted infection) testing, if you are at risk. Lung cancer screening. Colorectal cancer screening. Prostate cancer screening. Abdominal aortic aneurysm (AAA) screening. You may need this if you are a current or former smoker. Talk with your health care provider about your test results, treatment options, and if necessary, the need for more tests. Follow these instructions at home: Eating and drinking  Eat a diet that includes fresh fruits and vegetables, whole grains, lean protein, and low-fat dairy products. Limit your intake of foods with high amounts of sugar, saturated fats, and salt. Take vitamin and mineral supplements as recommended by your health care provider. Do not drink alcohol if your health care provider tells you not to drink. If you drink alcohol: Limit how much you have to 0-2 drinks a day. Know how much alcohol is in your drink. In the [place] one drink equals one 12 oz bottle of beer (355 mL), one 5 oz glass of wine (148 mL), or one 1 oz glass of hard liquor (44 mL). Lifestyle Brush your teeth every morning and night with fluoride toothpaste. Floss one time each day. Exercise for at least 30 minutes 5 or more days each week. Do not use any products that contain nicotine or tobacco. These products include cigarettes, chewing tobacco, and vaping devices, such as e-cigarettes. If you need help quitting, ask your health care provider. Do not use drugs. If you are sexually active, practice safe sex. Use a condom or other form of protection to prevent STIs. Take aspirin only as told by your health care provider. Make sure that you understand how much to take and what form to take. Work with your health care provider to find out whether it is safe  and beneficial for you to take aspirin daily. Ask your health care provider if you need to take a cholesterol-lowering medicine (statin). Find healthy ways to manage stress, such  as: Meditation, yoga, or listening to music. Journaling. Talking to a trusted person. Spending time with friends and family. Safety Always wear your seat belt while driving or riding in a vehicle. Do not drive: If you have been drinking alcohol. Do not ride with someone who has been drinking. When you are tired or distracted. While texting. If you have been using any mind-altering substances or drugs. Wear a helmet and other protective equipment during sports activities. If you have firearms in your house, make sure you follow all gun safety procedures. Minimize exposure to UV radiation to reduce your risk of skin cancer. What's next? Visit your health care provider once a year for an annual wellness visit. Ask your health care provider how often you should have your eyes and teeth checked. Stay up to date on all vaccines. This information is not intended to replace advice given to you by your health care provider. Make sure you discuss any questions you have with your health care provider. Document Revised: 05/05/2021 Document Reviewed: 05/05/2021 Elsevier Patient Education  2024 [employer].

## 2023-12-13 ENCOUNTER — Other Ambulatory Visit: Payer: Self-pay | Admitting: Family Medicine

## 2023-12-31 ENCOUNTER — Other Ambulatory Visit: Payer: Self-pay | Admitting: Family Medicine

## 2024-02-23 ENCOUNTER — Telehealth: Payer: Self-pay

## 2024-02-23 DIAGNOSIS — E785 Hyperlipidemia, unspecified: Secondary | ICD-10-CM

## 2024-02-23 DIAGNOSIS — I10 Essential (primary) hypertension: Secondary | ICD-10-CM

## 2024-02-23 NOTE — Telephone Encounter (Signed)
 Copied from CRM [id number]. Topic: Clinical - Request for Lab/Test Order >> Feb 23, 2024 10:47 AM Philippa Chester F wrote: Reason for CRM: Patient is calling in to have his lab appointment scheduled prior to his annual wellness visit. Patient would like for a panel of lab orders to be submitted so he can schedule his lab visit a week prior to his appointment with his PCP.  Please call the patient when lab orders have been placed for scheduling  [phone]

## 2024-02-23 NOTE — Telephone Encounter (Signed)
 Contacted patient left vm that labs were ordered and he can come in at his convince  to have this done

## 2024-02-27 DIAGNOSIS — I10 Essential (primary) hypertension: Secondary | ICD-10-CM | POA: Diagnosis not present

## 2024-02-27 DIAGNOSIS — E785 Hyperlipidemia, unspecified: Secondary | ICD-10-CM | POA: Diagnosis not present

## 2024-02-28 LAB — CMP14+EGFR
ALT: 28 IU/L (ref 0–44)
AST: 23 IU/L (ref 0–40)
Albumin: 4.3 g/dL (ref 3.7–4.7)
Alkaline Phosphatase: 80 IU/L (ref 44–121)
BUN/Creatinine Ratio: 15 (ref 10–24)
BUN: 14 mg/dL (ref 8–27)
Bilirubin Total: 0.7 mg/dL (ref 0.0–1.2)
CO2: 23 mmol/L (ref 20–29)
Calcium: 10.4 mg/dL — ABNORMAL HIGH (ref 8.6–10.2)
Chloride: 103 mmol/L (ref 96–106)
Creatinine, Ser: 0.95 mg/dL (ref 0.76–1.27)
Globulin, Total: 1.9 g/dL (ref 1.5–4.5)
Glucose: 105 mg/dL — ABNORMAL HIGH (ref 70–99)
Potassium: 4.4 mmol/L (ref 3.5–5.2)
Sodium: 141 mmol/L (ref 134–144)
Total Protein: 6.2 g/dL (ref 6.0–8.5)
eGFR: 78 mL/min/{1.73_m2} (ref >59)

## 2024-02-28 LAB — CBC WITH DIFFERENTIAL/PLATELET
Basophils Absolute: 0 10*3/uL (ref 0.0–0.2)
Basos: 0 %
EOS (ABSOLUTE): 0.1 10*3/uL (ref 0.0–0.4)
Eos: 3 %
Hematocrit: 46.2 % (ref 37.5–51.0)
Hemoglobin: 15.4 g/dL (ref 13.0–17.7)
Immature Grans (Abs): 0 10*3/uL (ref 0.0–0.1)
Immature Granulocytes: 0 %
Lymphocytes Absolute: 1.2 10*3/uL (ref 0.7–3.1)
Lymphs: 22 %
MCH: 30.4 pg (ref 26.6–33.0)
MCHC: 33.3 g/dL (ref 31.5–35.7)
MCV: 91 fL (ref 79–97)
Monocytes Absolute: 0.5 10*3/uL (ref 0.1–0.9)
Monocytes: 10 %
Neutrophils Absolute: 3.6 10*3/uL (ref 1.4–7.0)
Neutrophils: 65 %
Platelets: 197 10*3/uL (ref 150–450)
RBC: 5.06 x10E6/uL (ref 4.14–5.80)
RDW: 13 % (ref 11.6–15.4)
WBC: 5.6 10*3/uL (ref 3.4–10.8)

## 2024-02-28 LAB — LIPID PANEL WITH LDL/HDL RATIO
Cholesterol, Total: 112 mg/dL (ref 100–199)
HDL: 45 mg/dL (ref >39)
LDL Chol Calc (NIH): 51 mg/dL (ref 0–99)
LDL/HDL Ratio: 1.1 ratio (ref 0.0–3.6)
Triglycerides: 76 mg/dL (ref 0–149)
VLDL Cholesterol Cal: 16 mg/dL (ref 5–40)

## 2024-03-04 ENCOUNTER — Encounter: Payer: Self-pay | Admitting: Family Medicine

## 2024-03-04 ENCOUNTER — Telehealth: Payer: Self-pay | Admitting: Family Medicine

## 2024-03-04 ENCOUNTER — Ambulatory Visit (INDEPENDENT_AMBULATORY_CARE_PROVIDER_SITE_OTHER): Payer: Medicare Other | Admitting: Family Medicine

## 2024-03-04 VITALS — BP 134/55 | HR 64 | Ht 71.0 in | Wt 192.0 lb

## 2024-03-04 DIAGNOSIS — I4819 Other persistent atrial fibrillation: Secondary | ICD-10-CM | POA: Diagnosis not present

## 2024-03-04 DIAGNOSIS — R339 Retention of urine, unspecified: Secondary | ICD-10-CM | POA: Diagnosis not present

## 2024-03-04 DIAGNOSIS — E785 Hyperlipidemia, unspecified: Secondary | ICD-10-CM

## 2024-03-04 DIAGNOSIS — I10 Essential (primary) hypertension: Secondary | ICD-10-CM | POA: Diagnosis not present

## 2024-03-04 MED ORDER — DOXAZOSIN MESYLATE 1 MG PO TABS: 1.0000 mg | ORAL_TABLET | Freq: Every day | ORAL | 3 refills | Status: AC

## 2024-03-04 MED ORDER — DOXAZOSIN MESYLATE 1 MG PO TABS: ORAL_TABLET | ORAL | 3 refills | Status: DC

## 2024-03-04 NOTE — Progress Notes (Signed)
 Nation Cradle - 88 y.o. male MRN [id number]  Date of birth: 08-Apr-1936  Subjective Chief Complaint  Patient presents with   Hypertension    HPI Eric Villa is a 88 y.o. male here today for follow up visit.   He had labs completed prior to visit today.  These are relatively normal.   He remains on lisinopril/hydrochlorothiazide for management of HTN.  He is tolerating medication well.  He denies chest pain, shortness of breath, palpitations, headache or vision changes.  He does have history of A. Fib.  Denies new or worsening symptoms related to this.  He remains anticoagulated with Eliquis.  He is seeing cardiology consistently.   Tolerating simvastatin well.  No side effects at this time.   He continues to have difficulty starting urinary stream.  Tamsulosin was not very helpful.   ROS:  A comprehensive ROS was completed and negative except as noted per HPI  No Known Allergies  Past Medical History:  Diagnosis Date   Arthritis    Hypertension    Stroke [facility])     Past Surgical History:  Procedure Laterality Date   EYE SURGERY     SPINE SURGERY      Social History   Socioeconomic History   Marital status: Divorced    Spouse name: Not on file   Number of children: Not on file   Years of education: Not on file   Highest education level: Associate degree: occupational, Scientist, product/process development, or vocational program  Occupational History   Not on file  Tobacco Use   Smoking status: Former    Current packs/day: 0.00    Average packs/day: 1 pack/day for 25.0 years (25.0 ttl pk-yrs)    Types: Cigarettes    Start date: 11/21/1957    Quit date: 11/21/1982    Years since quitting: 41.3    Passive exposure: Never   Smokeless tobacco: Never  Vaping Use   Vaping status: Never Used  Substance and Sexual Activity   Alcohol use: Yes    Alcohol/week: 7.0 standard drinks of alcohol    Types: 4 Glasses of wine, 1 Cans of beer, 2 Shots of liquor per week   Drug use: Never   Sexual activity:  Yes    Partners: Female    Birth control/protection: None  Other Topics Concern   Not on file  Social History Narrative   Not on file   Social Drivers of Health   Financial Resource Strain: Low Risk  (02/29/2024)   Overall Financial Resource Strain (CARDIA)    Difficulty of Paying Living Expenses: Not hard at all  Food Insecurity: No Food Insecurity (02/29/2024)   Hunger Vital Sign    Worried About Running Out of Food in the Last Year: Never true    Ran Out of Food in the Last Year: Never true  Transportation Needs: No Transportation Needs (02/29/2024)   PRAPARE - Administrator, Civil Service (Medical): No    Lack of Transportation (Non-Medical): No  Physical Activity: Insufficiently Active (02/29/2024)   Exercise Vital Sign    Days of Exercise per Week: 3 days    Minutes of Exercise per Session: 30 min  Stress: No Stress Concern Present (02/29/2024)   [employer] of Occupational Health - Occupational Stress Questionnaire    Feeling of Stress : Not at all  Social Connections: Socially Isolated (02/29/2024)   Social Connection and Isolation Panel [NHANES]    Frequency of Communication with Friends and Family: More than three times a  week    Frequency of Social Gatherings with Friends and Family: Three times a week    Attends Religious Services: Never    Active Member of Clubs or Organizations: No    Attends Engineer, structural: Not on file    Marital Status: Divorced    Family History  Problem Relation Age of Onset   Tuberculosis Mother    Stroke Father     Health Maintenance  Topic Date Due   Medicare Annual Wellness (AWV)  11/10/2022   COVID-19 Vaccine (8 - 2024-25 season) 07/23/2023   INFLUENZA VACCINE  06/21/2024   DTaP/Tdap/Td (2 - Td or Tdap) 03/04/2033   Pneumonia Vaccine 17+ Years old  Completed   Zoster Vaccines- Shingrix  Completed   HPV VACCINES  Aged Out   Meningococcal B Vaccine  Aged Out      ----------------------------------------------------------------------------------------------------------------------------------------------------------------------------------------------------------------- Physical Exam BP (!) 134/55   Pulse 64   Ht 5\' 11"  (1.803 m)   Wt 192 lb (87.1 kg)   SpO2 95%   BMI 26.78 kg/m   Physical Exam Constitutional:      Appearance: Normal appearance.  Eyes:     General: No scleral icterus. Cardiovascular:     Rate and Rhythm: Normal rate and regular rhythm.  Pulmonary:     Effort: Pulmonary effort is normal.     Breath sounds: Normal breath sounds.  Neurological:     Mental Status: He is alert.  Psychiatric:        Mood and Affect: Mood normal.        Behavior: Behavior normal.     ------------------------------------------------------------------------------------------------------------------------------------------------------------------------------------------------------------------- Assessment and Plan  Incomplete bladder emptying Tamsulosin not very helpful.  We'll see if doxazosin works any better for him.  Refer to urology if not improving.   Benign essential hypertension BP is fairly well controlled.  Will continue lisinopril/hydrochlorothiazide at current strength.   Hyperlipidemia Continue simvastatin at current strength.   Persistent atrial fibrillation (HCC) Followed by cardiology. NSR today.  Continue eliquis for anticoagulation.    Meds ordered this encounter  Medications   doxazosin (CARDURA) 1 MG tablet    Sig: May increase to 2mg  after 2 weeks.    Dispense:  90 tablet    Refill:  3    No follow-ups on file.

## 2024-03-04 NOTE — Assessment & Plan Note (Signed)
 BP is fairly well controlled.  Will continue lisinopril/hydrochlorothiazide at current strength.

## 2024-03-04 NOTE — Assessment & Plan Note (Signed)
 Tamsulosin not very helpful.  We'll see if doxazosin works any better for him.  Refer to urology if not improving.

## 2024-03-04 NOTE — Telephone Encounter (Signed)
 Copied from CRM [id number]. Topic: Clinical - Prescription Issue >> Mar 04, 2024  9:33 AM Corin V wrote: Reason for CRM: Landa Pine with [facility] pharmacy called. They need additional clarification on the doxazosin (CARDURA) 1 MG tablet Prescription that was send in today. It states "May increase to 2mg  after 2 weeks" and they need to verify if it is 1 per day until the 2 week mark or what instructions the patient needs to be started with. Please update and send to: [id number] - [place], [place] - [address]

## 2024-03-04 NOTE — Assessment & Plan Note (Signed)
 Followed by cardiology. NSR today.  Continue eliquis for anticoagulation.

## 2024-03-04 NOTE — Assessment & Plan Note (Signed)
Continue simvastatin at current strength.   

## 2024-03-10 ENCOUNTER — Other Ambulatory Visit: Payer: Self-pay | Admitting: Family Medicine

## 2024-03-28 ENCOUNTER — Telehealth: Payer: Self-pay | Admitting: Family Medicine

## 2024-03-28 DIAGNOSIS — R339 Retention of urine, unspecified: Secondary | ICD-10-CM

## 2024-03-28 NOTE — Telephone Encounter (Signed)
 Copied from CRM [id number]. Topic: Referral - Request for Referral >> Mar 28, 2024  9:12 AM Retta Caster wrote: Did the patient discuss referral with their provider in the last year? Yes (If No - schedule appointment) (If Yes - send message)  Appointment offered? No Seen for this on 04/14  Type of order/referral and detailed reason for visit: doxazosin  (CARDURA ) 1 MG tablet Patient requesting referral to urologist as instructed by PCP if medication is not working. Patient stated medication not working for him. Needs call back when updated [phone]  Preference of office, provider, location: N/A  If referral order, have you been seen by this specialty before? No (If Yes, this issue or another issue? When? Where?  Can we respond through MyChart? Yes

## 2024-03-29 NOTE — Telephone Encounter (Signed)
 Complied with patient's request concerning referral. LVM advising Urology referral was placed. He should expect a call from them to schedule.

## 2024-03-29 NOTE — Addendum Note (Signed)
 Addended by: Bena Kobel E on: 03/29/2024 12:46 PM   Modules accepted: Orders

## 2024-04-03 ENCOUNTER — Other Ambulatory Visit: Payer: Self-pay | Admitting: Family Medicine

## 2024-04-03 DIAGNOSIS — R109 Unspecified abdominal pain: Secondary | ICD-10-CM | POA: Diagnosis not present

## 2024-04-03 DIAGNOSIS — Z7901 Long term (current) use of anticoagulants: Secondary | ICD-10-CM | POA: Diagnosis not present

## 2024-04-03 DIAGNOSIS — R300 Dysuria: Secondary | ICD-10-CM | POA: Diagnosis not present

## 2024-04-03 DIAGNOSIS — R339 Retention of urine, unspecified: Secondary | ICD-10-CM | POA: Diagnosis not present

## 2024-04-03 DIAGNOSIS — R350 Frequency of micturition: Secondary | ICD-10-CM | POA: Diagnosis not present

## 2024-04-03 DIAGNOSIS — Z87891 Personal history of nicotine dependence: Secondary | ICD-10-CM | POA: Diagnosis not present

## 2024-04-03 DIAGNOSIS — R3915 Urgency of urination: Secondary | ICD-10-CM | POA: Diagnosis not present

## 2024-04-03 DIAGNOSIS — F17210 Nicotine dependence, cigarettes, uncomplicated: Secondary | ICD-10-CM | POA: Diagnosis not present

## 2024-04-03 DIAGNOSIS — I10 Essential (primary) hypertension: Secondary | ICD-10-CM | POA: Diagnosis not present

## 2024-04-03 DIAGNOSIS — R3914 Feeling of incomplete bladder emptying: Secondary | ICD-10-CM | POA: Diagnosis not present

## 2024-04-03 DIAGNOSIS — I4891 Unspecified atrial fibrillation: Secondary | ICD-10-CM | POA: Diagnosis not present

## 2024-04-03 DIAGNOSIS — K5730 Diverticulosis of large intestine without perforation or abscess without bleeding: Secondary | ICD-10-CM | POA: Diagnosis not present

## 2024-04-03 DIAGNOSIS — R319 Hematuria, unspecified: Secondary | ICD-10-CM | POA: Diagnosis not present

## 2024-04-03 DIAGNOSIS — Z8673 Personal history of transient ischemic attack (TIA), and cerebral infarction without residual deficits: Secondary | ICD-10-CM | POA: Diagnosis not present

## 2024-04-05 DIAGNOSIS — Z85828 Personal history of other malignant neoplasm of skin: Secondary | ICD-10-CM | POA: Diagnosis not present

## 2024-04-05 DIAGNOSIS — R3915 Urgency of urination: Secondary | ICD-10-CM | POA: Diagnosis not present

## 2024-04-05 DIAGNOSIS — Z8673 Personal history of transient ischemic attack (TIA), and cerebral infarction without residual deficits: Secondary | ICD-10-CM | POA: Diagnosis not present

## 2024-04-05 DIAGNOSIS — Z79899 Other long term (current) drug therapy: Secondary | ICD-10-CM | POA: Diagnosis not present

## 2024-04-05 DIAGNOSIS — R300 Dysuria: Secondary | ICD-10-CM | POA: Diagnosis not present

## 2024-04-05 DIAGNOSIS — N3001 Acute cystitis with hematuria: Secondary | ICD-10-CM | POA: Diagnosis not present

## 2024-04-05 DIAGNOSIS — M1990 Unspecified osteoarthritis, unspecified site: Secondary | ICD-10-CM | POA: Diagnosis not present

## 2024-04-05 DIAGNOSIS — Z87891 Personal history of nicotine dependence: Secondary | ICD-10-CM | POA: Diagnosis not present

## 2024-04-05 DIAGNOSIS — R10819 Abdominal tenderness, unspecified site: Secondary | ICD-10-CM | POA: Diagnosis not present

## 2024-04-05 DIAGNOSIS — Z7901 Long term (current) use of anticoagulants: Secondary | ICD-10-CM | POA: Diagnosis not present

## 2024-04-05 DIAGNOSIS — R350 Frequency of micturition: Secondary | ICD-10-CM | POA: Diagnosis not present

## 2024-04-05 DIAGNOSIS — R339 Retention of urine, unspecified: Secondary | ICD-10-CM | POA: Diagnosis not present

## 2024-04-05 DIAGNOSIS — I10 Essential (primary) hypertension: Secondary | ICD-10-CM | POA: Diagnosis not present

## 2024-04-05 DIAGNOSIS — I4891 Unspecified atrial fibrillation: Secondary | ICD-10-CM | POA: Diagnosis not present

## 2024-04-07 DIAGNOSIS — I4891 Unspecified atrial fibrillation: Secondary | ICD-10-CM | POA: Diagnosis not present

## 2024-04-07 DIAGNOSIS — F17210 Nicotine dependence, cigarettes, uncomplicated: Secondary | ICD-10-CM | POA: Diagnosis not present

## 2024-04-07 DIAGNOSIS — T83038A Leakage of other indwelling urethral catheter, initial encounter: Secondary | ICD-10-CM | POA: Diagnosis not present

## 2024-04-07 DIAGNOSIS — Z87891 Personal history of nicotine dependence: Secondary | ICD-10-CM | POA: Diagnosis not present

## 2024-04-07 DIAGNOSIS — Z79899 Other long term (current) drug therapy: Secondary | ICD-10-CM | POA: Diagnosis not present

## 2024-04-07 DIAGNOSIS — I10 Essential (primary) hypertension: Secondary | ICD-10-CM | POA: Diagnosis not present

## 2024-04-07 DIAGNOSIS — Z7901 Long term (current) use of anticoagulants: Secondary | ICD-10-CM | POA: Diagnosis not present

## 2024-04-07 DIAGNOSIS — Z8673 Personal history of transient ischemic attack (TIA), and cerebral infarction without residual deficits: Secondary | ICD-10-CM | POA: Diagnosis not present

## 2024-04-07 DIAGNOSIS — T83091A Other mechanical complication of indwelling urethral catheter, initial encounter: Secondary | ICD-10-CM | POA: Diagnosis not present

## 2024-04-10 DIAGNOSIS — R339 Retention of urine, unspecified: Secondary | ICD-10-CM | POA: Diagnosis not present

## 2024-04-11 DIAGNOSIS — Z466 Encounter for fitting and adjustment of urinary device: Secondary | ICD-10-CM | POA: Diagnosis not present

## 2024-04-15 DIAGNOSIS — T83091A Other mechanical complication of indwelling urethral catheter, initial encounter: Secondary | ICD-10-CM | POA: Diagnosis not present

## 2024-04-15 DIAGNOSIS — I4891 Unspecified atrial fibrillation: Secondary | ICD-10-CM | POA: Diagnosis not present

## 2024-04-15 DIAGNOSIS — M25462 Effusion, left knee: Secondary | ICD-10-CM | POA: Diagnosis not present

## 2024-04-15 DIAGNOSIS — Z87891 Personal history of nicotine dependence: Secondary | ICD-10-CM | POA: Diagnosis not present

## 2024-04-15 DIAGNOSIS — M1712 Unilateral primary osteoarthritis, left knee: Secondary | ICD-10-CM | POA: Diagnosis not present

## 2024-04-15 DIAGNOSIS — R310 Gross hematuria: Secondary | ICD-10-CM | POA: Diagnosis not present

## 2024-04-15 DIAGNOSIS — T83011A Breakdown (mechanical) of indwelling urethral catheter, initial encounter: Secondary | ICD-10-CM | POA: Diagnosis not present

## 2024-04-15 DIAGNOSIS — T83511A Infection and inflammatory reaction due to indwelling urethral catheter, initial encounter: Secondary | ICD-10-CM | POA: Diagnosis not present

## 2024-04-15 DIAGNOSIS — F17210 Nicotine dependence, cigarettes, uncomplicated: Secondary | ICD-10-CM | POA: Diagnosis not present

## 2024-04-15 DIAGNOSIS — Z79899 Other long term (current) drug therapy: Secondary | ICD-10-CM | POA: Diagnosis not present

## 2024-04-15 DIAGNOSIS — I10 Essential (primary) hypertension: Secondary | ICD-10-CM | POA: Diagnosis not present

## 2024-04-15 DIAGNOSIS — Z7901 Long term (current) use of anticoagulants: Secondary | ICD-10-CM | POA: Diagnosis not present

## 2024-04-15 DIAGNOSIS — R339 Retention of urine, unspecified: Secondary | ICD-10-CM | POA: Diagnosis not present

## 2024-04-16 DIAGNOSIS — M25462 Effusion, left knee: Secondary | ICD-10-CM | POA: Diagnosis not present

## 2024-04-25 DIAGNOSIS — R339 Retention of urine, unspecified: Secondary | ICD-10-CM | POA: Diagnosis not present

## 2024-04-30 DIAGNOSIS — N390 Urinary tract infection, site not specified: Secondary | ICD-10-CM | POA: Diagnosis not present

## 2024-04-30 DIAGNOSIS — R300 Dysuria: Secondary | ICD-10-CM | POA: Diagnosis not present

## 2024-04-30 DIAGNOSIS — R319 Hematuria, unspecified: Secondary | ICD-10-CM | POA: Diagnosis not present

## 2024-05-03 DIAGNOSIS — M79671 Pain in right foot: Secondary | ICD-10-CM | POA: Diagnosis not present

## 2024-05-03 DIAGNOSIS — L03115 Cellulitis of right lower limb: Secondary | ICD-10-CM | POA: Diagnosis not present

## 2024-05-03 DIAGNOSIS — M2550 Pain in unspecified joint: Secondary | ICD-10-CM | POA: Diagnosis not present

## 2024-05-03 DIAGNOSIS — Z87891 Personal history of nicotine dependence: Secondary | ICD-10-CM | POA: Diagnosis not present

## 2024-05-03 DIAGNOSIS — R2241 Localized swelling, mass and lump, right lower limb: Secondary | ICD-10-CM | POA: Diagnosis not present

## 2024-05-03 DIAGNOSIS — M7989 Other specified soft tissue disorders: Secondary | ICD-10-CM | POA: Diagnosis not present

## 2024-05-13 DIAGNOSIS — N3090 Cystitis, unspecified without hematuria: Secondary | ICD-10-CM | POA: Diagnosis not present

## 2024-05-13 DIAGNOSIS — R339 Retention of urine, unspecified: Secondary | ICD-10-CM | POA: Diagnosis not present

## 2024-07-12 DIAGNOSIS — R319 Hematuria, unspecified: Secondary | ICD-10-CM | POA: Diagnosis not present

## 2024-07-12 DIAGNOSIS — R339 Retention of urine, unspecified: Secondary | ICD-10-CM | POA: Diagnosis not present

## 2024-07-25 DIAGNOSIS — R319 Hematuria, unspecified: Secondary | ICD-10-CM | POA: Diagnosis not present

## 2024-07-25 DIAGNOSIS — N3090 Cystitis, unspecified without hematuria: Secondary | ICD-10-CM | POA: Diagnosis not present

## 2024-08-01 DIAGNOSIS — I4819 Other persistent atrial fibrillation: Secondary | ICD-10-CM | POA: Diagnosis not present

## 2024-08-01 DIAGNOSIS — I10 Essential (primary) hypertension: Secondary | ICD-10-CM | POA: Diagnosis not present

## 2024-08-14 DIAGNOSIS — N189 Chronic kidney disease, unspecified: Secondary | ICD-10-CM | POA: Diagnosis not present

## 2024-08-14 DIAGNOSIS — I129 Hypertensive chronic kidney disease with stage 1 through stage 4 chronic kidney disease, or unspecified chronic kidney disease: Secondary | ICD-10-CM | POA: Diagnosis not present

## 2024-08-14 DIAGNOSIS — I4891 Unspecified atrial fibrillation: Secondary | ICD-10-CM | POA: Diagnosis not present

## 2024-09-03 ENCOUNTER — Ambulatory Visit: Admitting: Family Medicine

## 2024-09-09 DIAGNOSIS — R319 Hematuria, unspecified: Secondary | ICD-10-CM | POA: Diagnosis not present

## 2024-09-09 DIAGNOSIS — N3289 Other specified disorders of bladder: Secondary | ICD-10-CM | POA: Diagnosis not present

## 2024-09-10 DIAGNOSIS — I4819 Other persistent atrial fibrillation: Secondary | ICD-10-CM | POA: Diagnosis not present

## 2024-09-10 DIAGNOSIS — I10 Essential (primary) hypertension: Secondary | ICD-10-CM | POA: Diagnosis not present

## 2024-09-10 DIAGNOSIS — I493 Ventricular premature depolarization: Secondary | ICD-10-CM | POA: Diagnosis not present

## 2024-09-11 DIAGNOSIS — R319 Hematuria, unspecified: Secondary | ICD-10-CM | POA: Diagnosis not present

## 2024-09-17 DIAGNOSIS — E785 Hyperlipidemia, unspecified: Secondary | ICD-10-CM | POA: Diagnosis not present

## 2024-09-17 DIAGNOSIS — I10 Essential (primary) hypertension: Secondary | ICD-10-CM | POA: Diagnosis not present

## 2024-09-17 DIAGNOSIS — I4819 Other persistent atrial fibrillation: Secondary | ICD-10-CM | POA: Diagnosis not present

## 2024-09-17 DIAGNOSIS — I493 Ventricular premature depolarization: Secondary | ICD-10-CM | POA: Diagnosis not present

## 2024-09-17 DIAGNOSIS — D494 Neoplasm of unspecified behavior of bladder: Secondary | ICD-10-CM | POA: Diagnosis not present

## 2024-09-18 DIAGNOSIS — Z8673 Personal history of transient ischemic attack (TIA), and cerebral infarction without residual deficits: Secondary | ICD-10-CM | POA: Diagnosis not present

## 2024-09-18 DIAGNOSIS — I493 Ventricular premature depolarization: Secondary | ICD-10-CM | POA: Diagnosis not present

## 2024-09-18 DIAGNOSIS — Z0000 Encounter for general adult medical examination without abnormal findings: Secondary | ICD-10-CM | POA: Diagnosis not present

## 2024-09-18 DIAGNOSIS — E785 Hyperlipidemia, unspecified: Secondary | ICD-10-CM | POA: Diagnosis not present

## 2024-09-18 DIAGNOSIS — I4819 Other persistent atrial fibrillation: Secondary | ICD-10-CM | POA: Diagnosis not present

## 2024-09-18 DIAGNOSIS — D494 Neoplasm of unspecified behavior of bladder: Secondary | ICD-10-CM | POA: Diagnosis not present

## 2024-09-18 DIAGNOSIS — I10 Essential (primary) hypertension: Secondary | ICD-10-CM | POA: Diagnosis not present
